# Patient Record
Sex: Male | Born: 1975 | Race: White | Hispanic: No | Marital: Single | Smoking: Current every day smoker
Health system: Southern US, Community
[De-identification: ages and names within clinical notes are randomized; demographics above are authoritative.]

## PROBLEM LIST (undated history)

## (undated) ENCOUNTER — Emergency Department (HOSPITAL_COMMUNITY): Payer: Self-pay

## (undated) DIAGNOSIS — F1410 Cocaine abuse, uncomplicated: Secondary | ICD-10-CM

## (undated) DIAGNOSIS — W3400XA Accidental discharge from unspecified firearms or gun, initial encounter: Secondary | ICD-10-CM

## (undated) DIAGNOSIS — E785 Hyperlipidemia, unspecified: Secondary | ICD-10-CM

## (undated) DIAGNOSIS — I2510 Atherosclerotic heart disease of native coronary artery without angina pectoris: Secondary | ICD-10-CM

## (undated) DIAGNOSIS — Z720 Tobacco use: Secondary | ICD-10-CM

## (undated) DIAGNOSIS — I509 Heart failure, unspecified: Secondary | ICD-10-CM

## (undated) DIAGNOSIS — R569 Unspecified convulsions: Secondary | ICD-10-CM

## (undated) DIAGNOSIS — F1920 Other psychoactive substance dependence, uncomplicated: Secondary | ICD-10-CM

## (undated) HISTORY — PX: CARDIAC CATHETERIZATION: SHX172

## (undated) HISTORY — PX: ABDOMINAL SURGERY: SHX537

## (undated) HISTORY — DX: Unspecified convulsions: R56.9

## (undated) HISTORY — DX: Heart failure, unspecified: I50.9

---

## 2000-09-01 ENCOUNTER — Emergency Department (HOSPITAL_COMMUNITY): Admission: EM | Admit: 2000-09-01 | Discharge: 2000-09-01 | Payer: Self-pay | Admitting: Emergency Medicine

## 2004-08-08 ENCOUNTER — Emergency Department: Payer: Self-pay | Admitting: Emergency Medicine

## 2004-12-01 ENCOUNTER — Emergency Department: Payer: Self-pay | Admitting: Emergency Medicine

## 2013-02-26 DIAGNOSIS — W3400XA Accidental discharge from unspecified firearms or gun, initial encounter: Secondary | ICD-10-CM

## 2013-02-26 DIAGNOSIS — Y249XXA Unspecified firearm discharge, undetermined intent, initial encounter: Secondary | ICD-10-CM

## 2013-02-26 HISTORY — DX: Unspecified firearm discharge, undetermined intent, initial encounter: Y24.9XXA

## 2013-02-26 HISTORY — DX: Accidental discharge from unspecified firearms or gun, initial encounter: W34.00XA

## 2013-05-01 ENCOUNTER — Emergency Department: Payer: Self-pay | Admitting: Emergency Medicine

## 2013-05-16 ENCOUNTER — Emergency Department: Payer: Self-pay | Admitting: Emergency Medicine

## 2013-06-09 ENCOUNTER — Emergency Department: Payer: Self-pay | Admitting: Emergency Medicine

## 2013-07-02 ENCOUNTER — Emergency Department: Payer: Self-pay | Admitting: Emergency Medicine

## 2014-01-01 ENCOUNTER — Inpatient Hospital Stay (HOSPITAL_COMMUNITY)
Admission: EM | Admit: 2014-01-01 | Discharge: 2014-01-03 | DRG: 247 | Disposition: A | Discharge status: Home / Self Care | Payer: Self-pay | Source: Other Acute Inpatient Hospital | Attending: Cardiovascular Disease | Admitting: Cardiovascular Disease

## 2014-01-01 ENCOUNTER — Emergency Department: Payer: Self-pay | Admitting: Emergency Medicine

## 2014-01-01 ENCOUNTER — Encounter (HOSPITAL_COMMUNITY): Payer: Self-pay | Admitting: Cardiovascular Disease

## 2014-01-01 ENCOUNTER — Encounter (HOSPITAL_COMMUNITY)
Admission: EM | Disposition: A | Discharge status: Home / Self Care | Payer: Self-pay | Attending: Cardiovascular Disease

## 2014-01-01 ENCOUNTER — Inpatient Hospital Stay (HOSPITAL_COMMUNITY): Payer: Self-pay

## 2014-01-01 DIAGNOSIS — I2510 Atherosclerotic heart disease of native coronary artery without angina pectoris: Secondary | ICD-10-CM | POA: Diagnosis present

## 2014-01-01 DIAGNOSIS — I2111 ST elevation (STEMI) myocardial infarction involving right coronary artery: Secondary | ICD-10-CM

## 2014-01-01 DIAGNOSIS — Z720 Tobacco use: Secondary | ICD-10-CM | POA: Diagnosis present

## 2014-01-01 DIAGNOSIS — I219 Acute myocardial infarction, unspecified: Secondary | ICD-10-CM

## 2014-01-01 DIAGNOSIS — F1410 Cocaine abuse, uncomplicated: Secondary | ICD-10-CM | POA: Diagnosis present

## 2014-01-01 DIAGNOSIS — Z23 Encounter for immunization: Secondary | ICD-10-CM

## 2014-01-01 DIAGNOSIS — F17210 Nicotine dependence, cigarettes, uncomplicated: Secondary | ICD-10-CM | POA: Diagnosis present

## 2014-01-01 DIAGNOSIS — Z87828 Personal history of other (healed) physical injury and trauma: Secondary | ICD-10-CM

## 2014-01-01 DIAGNOSIS — F1290 Cannabis use, unspecified, uncomplicated: Secondary | ICD-10-CM | POA: Diagnosis present

## 2014-01-01 DIAGNOSIS — W3400XA Accidental discharge from unspecified firearms or gun, initial encounter: Secondary | ICD-10-CM

## 2014-01-01 DIAGNOSIS — F1920 Other psychoactive substance dependence, uncomplicated: Secondary | ICD-10-CM | POA: Diagnosis present

## 2014-01-01 DIAGNOSIS — E785 Hyperlipidemia, unspecified: Secondary | ICD-10-CM | POA: Diagnosis present

## 2014-01-01 DIAGNOSIS — I2119 ST elevation (STEMI) myocardial infarction involving other coronary artery of inferior wall: Principal | ICD-10-CM | POA: Diagnosis present

## 2014-01-01 HISTORY — DX: Cocaine abuse, uncomplicated: F14.10

## 2014-01-01 HISTORY — DX: Hyperlipidemia, unspecified: E78.5

## 2014-01-01 HISTORY — DX: Other psychoactive substance dependence, uncomplicated: F19.20

## 2014-01-01 HISTORY — DX: Accidental discharge from unspecified firearms or gun, initial encounter: W34.00XA

## 2014-01-01 HISTORY — DX: Atherosclerotic heart disease of native coronary artery without angina pectoris: I25.10

## 2014-01-01 HISTORY — DX: Tobacco use: Z72.0

## 2014-01-01 HISTORY — PX: LEFT HEART CATHETERIZATION WITH CORONARY ANGIOGRAM: SHX5451

## 2014-01-01 LAB — CK-MB: CK-MB: 11.2 ng/mL — ABNORMAL HIGH (ref 0.5–3.6)

## 2014-01-01 LAB — CBC
HCT: 49 % (ref 40.0–52.0)
HGB: 16 g/dL (ref 13.0–18.0)
MCH: 30 pg (ref 26.0–34.0)
MCHC: 32.6 g/dL (ref 32.0–36.0)
MCV: 92 fL (ref 80–100)
Platelet: 347 10*3/uL (ref 150–440)
RBC: 5.33 10*6/uL (ref 4.40–5.90)
RDW: 13.2 % (ref 11.5–14.5)
WBC: 12.2 10*3/uL — ABNORMAL HIGH (ref 3.8–10.6)

## 2014-01-01 LAB — PRO B NATRIURETIC PEPTIDE: PRO B NATRI PEPTIDE: 301.3 pg/mL — AB (ref 0–125)

## 2014-01-01 LAB — CK TOTAL AND CKMB (NOT AT ARMC)
CK, MB: 17.9 ng/mL (ref 0.3–4.0)
Relative Index: 7 — ABNORMAL HIGH (ref 0.0–2.5)
Total CK: 254 U/L — ABNORMAL HIGH (ref 7–232)

## 2014-01-01 LAB — TROPONIN I
TROPONIN I: 4.05 ng/mL — AB (ref <0.30)
TROPONIN-I: 1.9 ng/mL — AB
Troponin I: 3.25 ng/mL (ref <0.30)

## 2014-01-01 LAB — COMPREHENSIVE METABOLIC PANEL
ALK PHOS: 107 U/L (ref 39–117)
ALT: 22 U/L (ref 0–53)
AST: 31 U/L (ref 0–37)
Albumin: 3.7 g/dL (ref 3.5–5.2)
Anion gap: 11 (ref 5–15)
BUN: 8 mg/dL (ref 6–23)
CO2: 25 mEq/L (ref 19–32)
Calcium: 8.9 mg/dL (ref 8.4–10.5)
Chloride: 103 mEq/L (ref 96–112)
Creatinine, Ser: 0.75 mg/dL (ref 0.50–1.35)
GFR calc Af Amer: >90 mL/min (ref >90)
Glucose, Bld: 115 mg/dL — ABNORMAL HIGH (ref 70–99)
POTASSIUM: 3.6 meq/L — AB (ref 3.7–5.3)
Sodium: 139 mEq/L (ref 137–147)
Total Bilirubin: 1.1 mg/dL (ref 0.3–1.2)
Total Protein: 7.3 g/dL (ref 6.0–8.3)

## 2014-01-01 LAB — BASIC METABOLIC PANEL
Anion Gap: 7 (ref 7–16)
BUN: 10 mg/dL (ref 7–18)
CHLORIDE: 103 mmol/L (ref 98–107)
Calcium, Total: 8.8 mg/dL (ref 8.5–10.1)
Co2: 28 mmol/L (ref 21–32)
Creatinine: 1.01 mg/dL (ref 0.60–1.30)
EGFR (Non-African Amer.): >60
Glucose: 91 mg/dL (ref 65–99)
Osmolality: 274 (ref 275–301)
POTASSIUM: 4 mmol/L (ref 3.5–5.1)
Sodium: 138 mmol/L (ref 136–145)

## 2014-01-01 LAB — HEMOGLOBIN A1C
Hgb A1c MFr Bld: 5.5 % (ref <5.7)
Mean Plasma Glucose: 111 mg/dL (ref <117)

## 2014-01-01 LAB — MRSA PCR SCREENING: MRSA BY PCR: NEGATIVE

## 2014-01-01 LAB — TSH: TSH: 2.89 u[IU]/mL (ref 0.350–4.500)

## 2014-01-01 SURGERY — LEFT HEART CATHETERIZATION WITH CORONARY ANGIOGRAM
Anesthesia: LOCAL

## 2014-01-01 MED ORDER — LIDOCAINE HCL (PF) 1 % IJ SOLN
INTRAMUSCULAR | Status: AC
  Filled 2014-01-01: qty 30

## 2014-01-01 MED ORDER — BIVALIRUDIN 250 MG IV SOLR
INTRAVENOUS | Status: AC
  Filled 2014-01-01: qty 250

## 2014-01-01 MED ORDER — ASPIRIN 81 MG PO CHEW
81.0000 mg | CHEWABLE_TABLET | Freq: Every day | ORAL | Status: DC
  Administered 2014-01-02 – 2014-01-03 (×2): 81 mg via ORAL
  Filled 2014-01-01 (×2): qty 1

## 2014-01-01 MED ORDER — ONDANSETRON HCL 4 MG/2ML IJ SOLN: 4.0000 mg | Freq: Four times a day (QID) | INTRAMUSCULAR | Status: DC | PRN

## 2014-01-01 MED ORDER — NITROGLYCERIN 0.4 MG SL SUBL: 0.4000 mg | SUBLINGUAL_TABLET | SUBLINGUAL | Status: DC | PRN

## 2014-01-01 MED ORDER — SODIUM CHLORIDE 0.9 % IV SOLN
INTRAVENOUS | Status: AC
  Administered 2014-01-01: 15:00:00 via INTRAVENOUS

## 2014-01-01 MED ORDER — OXYCODONE-ACETAMINOPHEN 5-325 MG PO TABS
1.0000 | ORAL_TABLET | ORAL | Status: DC | PRN
  Administered 2014-01-01: 2 via ORAL
  Filled 2014-01-01: qty 2

## 2014-01-01 MED ORDER — SODIUM CHLORIDE 0.9 % IV SOLN
0.2500 mg/kg/h | INTRAVENOUS | Status: DC
  Filled 2014-01-01: qty 250

## 2014-01-01 MED ORDER — ACETAMINOPHEN 325 MG PO TABS: 650.0000 mg | ORAL_TABLET | ORAL | Status: DC | PRN

## 2014-01-01 MED ORDER — MORPHINE SULFATE 4 MG/ML IJ SOLN
4.0000 mg | INTRAMUSCULAR | Status: DC | PRN
  Administered 2014-01-01: 4 mg via INTRAVENOUS
  Filled 2014-01-01: qty 1

## 2014-01-01 MED ORDER — INFLUENZA VAC SPLIT QUAD 0.5 ML IM SUSY
0.5000 mL | PREFILLED_SYRINGE | INTRAMUSCULAR | Status: AC
  Administered 2014-01-02: 0.5 mL via INTRAMUSCULAR
  Filled 2014-01-01: qty 0.5

## 2014-01-01 MED ORDER — MIDAZOLAM HCL 2 MG/2ML IJ SOLN
INTRAMUSCULAR | Status: AC
  Filled 2014-01-01: qty 2

## 2014-01-01 MED ORDER — FENTANYL CITRATE 0.05 MG/ML IJ SOLN
INTRAMUSCULAR | Status: AC
  Filled 2014-01-01: qty 2

## 2014-01-01 MED ORDER — ATORVASTATIN CALCIUM 80 MG PO TABS
80.0000 mg | ORAL_TABLET | Freq: Every day | ORAL | Status: DC
  Administered 2014-01-01 – 2014-01-02 (×2): 80 mg via ORAL
  Filled 2014-01-01 (×3): qty 1

## 2014-01-01 MED ORDER — HEPARIN (PORCINE) IN NACL 2-0.9 UNIT/ML-% IJ SOLN
INTRAMUSCULAR | Status: AC
  Filled 2014-01-01: qty 1500

## 2014-01-01 MED ORDER — PNEUMOCOCCAL VAC POLYVALENT 25 MCG/0.5ML IJ INJ
0.5000 mL | INJECTION | INTRAMUSCULAR | Status: AC
Start: 2014-01-02 — End: 2014-01-02
  Administered 2014-01-02: 0.5 mL via INTRAMUSCULAR
  Filled 2014-01-01: qty 0.5

## 2014-01-01 MED ORDER — POTASSIUM CHLORIDE CRYS ER 20 MEQ PO TBCR
40.0000 meq | EXTENDED_RELEASE_TABLET | Freq: Once | ORAL | Status: AC
  Administered 2014-01-01: 40 meq via ORAL
  Filled 2014-01-01: qty 2

## 2014-01-01 MED ORDER — CETYLPYRIDINIUM CHLORIDE 0.05 % MT LIQD
7.0000 mL | Freq: Two times a day (BID) | OROMUCOSAL | Status: DC
  Administered 2014-01-02 (×2): 7 mL via OROMUCOSAL

## 2014-01-01 MED ORDER — VERAPAMIL HCL 2.5 MG/ML IV SOLN
INTRAVENOUS | Status: AC
  Filled 2014-01-01: qty 2

## 2014-01-01 MED ORDER — NITROGLYCERIN 1 MG/10 ML FOR IR/CATH LAB
INTRA_ARTERIAL | Status: AC
  Filled 2014-01-01: qty 10

## 2014-01-01 MED ORDER — TICAGRELOR 90 MG PO TABS
90.0000 mg | ORAL_TABLET | Freq: Two times a day (BID) | ORAL | Status: DC
  Administered 2014-01-01 – 2014-01-03 (×4): 90 mg via ORAL
  Filled 2014-01-01 (×5): qty 1

## 2014-01-01 NOTE — Progress Notes (Signed)
Chaplain checked in with ED to see if family present. Chaplain made her presence known and asked ED registration to page her if/when family arrived. Page chaplain as needed.   01/01/14 1200  Clinical Encounter Type  Visited With Health care provider  Visit Type Code  Referral From Nurse  Charmian Muff, Chaplain 01/01/2014 3:27 PM

## 2014-01-01 NOTE — Progress Notes (Signed)
CRITICAL VALUE ALERT  Critical value received:  Troponin 3.25, CKMB 17.9, Potassium 3.6  Date of notification:  01/01/2014  Time of notification:  1730  Critical value read back:Yes.    Nurse who received alert:  Dayna Barker  MD notified (1st page):  Dr. Tresa Endo  Time of first page:  1735  MD notified (2nd page):  Time of second page:  Responding MD:  Dr. Tresa Endo  Time MD responded:  1737, orders received

## 2014-01-01 NOTE — Progress Notes (Signed)
EKG CRITICAL VALUE     12 lead EKG performed.  Critical value noted.Dayna Barker, RN notified.   Apollonia Amini L, CCT 01/01/2014 2:30 PM

## 2014-01-01 NOTE — CV Procedure (Signed)
Cardiac Catheterization Procedure Note  Name: Sallie Maker MRN: [id number] DOB: Aug 29, 1975  Procedure: Left Heart Cath, Selective Coronary Angiography, LV angiography, PTCA and stenting of the mid-RCA  Indication: Inferior STEMI  Procedural Details:  The right wrist was prepped, draped, and anesthetized with 1% lidocaine. Using the modified Seldinger technique, a 5/6 French Slender sheath was introduced into the right radial artery. 3 mg of verapamil was administered through the sheath, weight-based unfractionated heparin was administered intravenously. Standard Judkins catheters were used for selective coronary angiography and left ventriculography. Ventriculography was done after PCI. Catheter exchanges were performed over an exchange length guidewire.  PROCEDURAL FINDINGS Hemodynamics: AO 111/72 LV 113/9   Coronary angiography: Coronary dominance: right  Left mainstem: Patent with mild diffuse irregularity but no obstruction.  Left anterior descending (LAD): Diffuse atherosclerosis noted with heavy irregularity throughout the prox and mid-LAD and first diagonal. The mid-LAD has 60% stenosis  Left circumflex (LCx): Widely patent vessel supplies a single OM. No obstruction.  Right coronary artery (RCA): Large dominant vessel. There is thrombus in the ostium of the acute marginal branch. There is 80% ostial stenosis with thrombus associated. The mid-RCA in the area of the acute marginal has diffuse 40% irregularity. The PDA and PLA branches are patent.   Left ventriculography: Low normal LV function with mild global LV hypokinesis. LVEF is 45-50%. No MR visualized.  PCI Note:  Following the diagnostic procedure, the decision was made to proceed with PCI of the RCA/acute marginal bifurcation. This is a complex area as it involves the bifurcation and the acute marginal appears to supply a significant amount of myocardium. Wires were advanced into each vessel without difficulty.  Weight-based bivalirudin was given for anticoagulation. Once a therapeutic ACT was achieved, a 6 [place] JR4 guide catheter was inserted.   The lesion was predilated with a 2.5 mm balloon.  There was significant (>50% residual stenosis) stenosis so a 3.0 balloon was used to redilate the vessel. The lesion was then stented with a 3.0 x 12 mm Promus DES. A 3.5 mm balloon was inflated simultaneously in the mid RCA at the time of stent deployment. Despite stent position in the origin of the acute marginal, the stent appeared to migrate proximally after deployment and was located in the mid-RCA. I decided to perform IVUS to clarify the anatomy. The proximal stent was well-deployed but no apposed. The distal stent was underdeployed. The entire stent was post-dilated with a 3.5 mm [place] balloon to high pressure and the proximal stent was dilated with a 4.0 mm [place] balloon to 16 atm.  Following PCI, there was 30% residual stenosis in the acute marginal and no residual stenosis in the mid-RCA and TIMI-3 flow in both vessels. Final angiography confirmed an excellent result. The patient tolerated the procedure well. There were no immediate procedural complications. A TR band was used for radial hemostasis. The patient was transferred to the post catheterization recovery area for further monitoring.  PCI Data: Lesion 1 Vessel - acute marginal/Segment - ostium Percent Stenosis (pre)  80 TIMI-flow 3 Stent none Percent Stenosis (post) 30 TIMI-flow (post) 3  Lesion 2 Vessel - RCA/Segment - mid Percent Stenosis (pre)  50 (thrombus) TIMI-flow 3 Stent 3.0x12 mm Promus (post-dil with 4.0 [place] balloon to 16 atm) Percent Stenosis (post) 0 TIMI-flow (post) 3  Estimated Blood Loss: minimal  Final Conclusions:   1. Thrombotic stenosis of the RCA/acute marginal treated with PCI as detailed above (single DES and bifurcation angioplasty) 2. Moderate LAD stenosis  3. Patent LCx 4. Low-normal LV function   Recommendations:    ASA/brilinta x 12 months if feasible. May need to change to plavix after 30 days. Post-MI med Rx. No beta-blocker (recent cocaine use).  Sherren Mocha MD, [facility] 01/01/2014, 1:40 PM
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
## 2014-01-02 DIAGNOSIS — I2111 ST elevation (STEMI) myocardial infarction involving right coronary artery: Secondary | ICD-10-CM

## 2014-01-02 LAB — CBC
HCT: 41.8 % (ref 39.0–52.0)
Hemoglobin: 14.1 g/dL (ref 13.0–17.0)
MCH: 30.7 pg (ref 26.0–34.0)
MCHC: 33.7 g/dL (ref 30.0–36.0)
MCV: 90.9 fL (ref 78.0–100.0)
Platelets: 259 10*3/uL (ref 150–400)
RBC: 4.6 MIL/uL (ref 4.22–5.81)
RDW: 13 % (ref 11.5–15.5)
WBC: 7.2 10*3/uL (ref 4.0–10.5)

## 2014-01-02 LAB — LIPID PANEL
Cholesterol: 169 mg/dL (ref 0–200)
HDL: 56 mg/dL (ref >39)
LDL CALC: 91 mg/dL (ref 0–99)
TRIGLYCERIDES: 108 mg/dL (ref <150)
Total CHOL/HDL Ratio: 3 RATIO
VLDL: 22 mg/dL (ref 0–40)

## 2014-01-02 LAB — BASIC METABOLIC PANEL
Anion gap: 12 (ref 5–15)
BUN: 9 mg/dL (ref 6–23)
CO2: 21 mEq/L (ref 19–32)
Calcium: 8.6 mg/dL (ref 8.4–10.5)
Chloride: 104 mEq/L (ref 96–112)
Creatinine, Ser: 0.83 mg/dL (ref 0.50–1.35)
GFR calc Af Amer: >90 mL/min (ref >90)
GFR calc non Af Amer: >90 mL/min (ref >90)
GLUCOSE: 107 mg/dL — AB (ref 70–99)
POTASSIUM: 4.4 meq/L (ref 3.7–5.3)
Sodium: 137 mEq/L (ref 137–147)

## 2014-01-02 LAB — TROPONIN I: Troponin I: 2.1 ng/mL (ref <0.30)

## 2014-01-02 NOTE — Plan of Care (Signed)
Problem: Phase II Progression Outcomes Goal: Hemodynamically stable Outcome: Completed/Met Date Met:  01/02/14 Goal: Vascular site scale level 0 - I Vascular Site Scale Level 0: No bruising/bleeding/hematoma Level I (Mild): Bruising/Ecchymosis, minimal bleeding/ooozing, palpable hematoma < 3 cm Level II (Moderate): Bleeding not affecting hemodynamic parameters, pseudoaneurysm, palpable hematoma > 3 cm Level III (Severe) Bleeding which affects hemodynamic parameters or retroperitoneal hemorrhage  Outcome: Completed/Met Date Met:  01/02/14 Goal: Wean IV nitroglycerin to PO or topical Outcome: Completed/Met Date Met:  01/02/14 Goal: Tolerating diet Outcome: Completed/Met Date Met:  01/02/14

## 2014-01-02 NOTE — Progress Notes (Signed)
Radial TR band removed by Devonne Doughty, RN. No complications, site is level 0. Transparent dressing placed.

## 2014-01-02 NOTE — Plan of Care (Signed)
Problem: Phase I Progression Outcomes Goal: Anginal pain relieved Outcome: Completed/Met Date Met:  01/02/14     

## 2014-01-02 NOTE — Plan of Care (Signed)
Problem: Phase II Progression Outcomes Goal: Anginal pain absent Outcome: Completed/Met Date Met:  01/02/14 Goal: Discharge plan established Outcome: Completed/Met Date Met:  01/02/14

## 2014-01-02 NOTE — Progress Notes (Signed)
Patient Name: Peter Ruiz Date of Encounter: 01/02/2014     Active Problems:   Acute transmural inferior wall MI    SUBJECTIVE  The patient feels well this morning.  No chest pain or shortness of breath.  Vital signs are stable.  He is on dual antiplatelet therapy and high-dose statin.  No beta blocker because of cocaine use.  CURRENT MEDS . antiseptic oral rinse  7 mL Mouth Rinse BID  . aspirin  81 mg Oral Daily  . atorvastatin  80 mg Oral q1800  . Influenza vac split quadrivalent PF  0.5 mL Intramuscular Tomorrow-1000  . pneumococcal 23 valent vaccine  0.5 mL Intramuscular Tomorrow-1000  . ticagrelor  90 mg Oral BID    OBJECTIVE  Filed Vitals:   01/02/14 0500 01/02/14 0600 01/02/14 0700 01/02/14 0800  BP: 99/59 99/69 107/63   Pulse: 53 57 57   Temp:    98 F (36.7 C)  TempSrc:    Oral  Resp: 8 13 17    Height:      Weight:      SpO2: 99% 100% 100%     Intake/Output Summary (Last 24 hours) at 01/02/14 0916 Last data filed at 01/02/14 0800  Gross per 24 hour  Intake 1446.67 ml  Output   1950 ml  Net -503.33 ml   Filed Weights   01/01/14 1100 01/01/14 1410 01/02/14 0300  Weight: 189 lb 9.5 oz (86 kg) 185 lb 10 oz (84.2 kg) 193 lb 2 oz (87.6 kg)    PHYSICAL EXAM  General: Pleasant, NAD. Neuro: Alert and oriented X 3. Moves all extremities spontaneously. Psych: Normal affect. HEENT:  Normal  Neck: Supple without bruits or JVD. Lungs:  Resp regular and unlabored, CTA. Heart: RRR no s3, s4, or murmurs. Abdomen: Soft, non-tender, non-distended, BS + x 4.  Extremities: No clubbing, cyanosis or edema. DP/PT/Radials 2+ and equal bilaterally.  Accessory Clinical Findings  CBC  Recent Labs  01/02/14 0215  WBC 7.2  HGB 14.1  HCT 41.8  MCV 90.9  PLT 259   Basic Metabolic Panel  Recent Labs  01/01/14 1600 01/02/14 0215  NA 139 137  K 3.6* 4.4  CL 103 104  CO2 25 21  GLUCOSE 115* 107*  BUN 8 9  CREATININE 0.75 0.83  CALCIUM 8.9 8.6    Liver Function Tests  Recent Labs  01/01/14 1600  AST 31  ALT 22  ALKPHOS 107  BILITOT 1.1  PROT 7.3  ALBUMIN 3.7   No results for input(s): LIPASE, AMYLASE in the last 72 hours. Cardiac Enzymes  Recent Labs  01/01/14 1600 01/01/14 1948 01/02/14 0215  CKTOTAL 254*  --   --   CKMB 17.9*  --   --   TROPONINI 3.25* 4.05* 2.10*   BNP Invalid input(s): POCBNP D-Dimer No results for input(s): DDIMER in the last 72 hours. Hemoglobin A1C  Recent Labs  01/01/14 1600  HGBA1C 5.5   Fasting Lipid Panel  Recent Labs  01/02/14 0215  CHOL 169  HDL 56  LDLCALC 91  TRIG 108  CHOLHDL 3.0   Thyroid Function Tests  Recent Labs  01/01/14 1600  TSH 2.890    TELE  Normal sinus rhythm  ECG  Normal sinus rhythm with evolving inferior wall MI  Radiology/Studies  Portable Chest X-ray 1 View  01/01/2014   CLINICAL DATA:  Acute myocardial infarct.  Coronary artery disease.  EXAM: PORTABLE CHEST - 1 VIEW  COMPARISON:  None.  FINDINGS: The heart  size and mediastinal contours are within normal limits. Both lungs are clear. No evidence pleural effusion. Several old left posterior rib fracture deformities are noted.  IMPRESSION: No active disease.   Electronically Signed   By: Myles Rosenthal M.D.   On: 01/01/2014 14:46    ASSESSMENT AND PLAN  1. Thrombotic stenosis of the RCA/acute marginal treated with PCI as detailed above (single DES and bifurcation angioplasty) 2. Moderate LAD stenosis 3. Patent LCx 4. Low-normal LV function   Plan: Transfer to telemetry today.  Cardiac rehabilitation.  Ambulate.  Likely discharge Monday. Signed, Cassell Clement MD

## 2014-01-03 ENCOUNTER — Encounter (HOSPITAL_COMMUNITY): Payer: Self-pay | Admitting: Physician Assistant

## 2014-01-03 DIAGNOSIS — I2510 Atherosclerotic heart disease of native coronary artery without angina pectoris: Secondary | ICD-10-CM | POA: Diagnosis present

## 2014-01-03 DIAGNOSIS — W3400XA Accidental discharge from unspecified firearms or gun, initial encounter: Secondary | ICD-10-CM

## 2014-01-03 DIAGNOSIS — E785 Hyperlipidemia, unspecified: Secondary | ICD-10-CM

## 2014-01-03 DIAGNOSIS — Z720 Tobacco use: Secondary | ICD-10-CM | POA: Diagnosis present

## 2014-01-03 DIAGNOSIS — F1920 Other psychoactive substance dependence, uncomplicated: Secondary | ICD-10-CM | POA: Diagnosis present

## 2014-01-03 DIAGNOSIS — I2119 ST elevation (STEMI) myocardial infarction involving other coronary artery of inferior wall: Principal | ICD-10-CM

## 2014-01-03 DIAGNOSIS — F1410 Cocaine abuse, uncomplicated: Secondary | ICD-10-CM | POA: Diagnosis present

## 2014-01-03 LAB — POCT ACTIVATED CLOTTING TIME: Activated Clotting Time: 461 seconds

## 2014-01-03 MED ORDER — TICAGRELOR 90 MG PO TABS: 90.0000 mg | ORAL_TABLET | Freq: Two times a day (BID) | ORAL | Status: DC

## 2014-01-03 MED ORDER — NICOTINE 14 MG/24HR TD PT24: 14.0000 mg | MEDICATED_PATCH | Freq: Every day | TRANSDERMAL | Status: DC

## 2014-01-03 MED ORDER — ASPIRIN 81 MG PO CHEW: 81.0000 mg | CHEWABLE_TABLET | Freq: Every day | ORAL | Status: AC

## 2014-01-03 MED ORDER — NITROGLYCERIN 0.4 MG SL SUBL: 0.4000 mg | SUBLINGUAL_TABLET | SUBLINGUAL | Status: AC | PRN

## 2014-01-03 MED ORDER — ATORVASTATIN CALCIUM 80 MG PO TABS: 80.0000 mg | ORAL_TABLET | Freq: Every day | ORAL | Status: DC

## 2014-01-03 MED ORDER — NICOTINE 14 MG/24HR TD PT24
14.0000 mg | MEDICATED_PATCH | Freq: Every day | TRANSDERMAL | Status: DC
  Filled 2014-01-03: qty 1

## 2014-01-03 MED FILL — Sodium Chloride IV Soln 0.9%: INTRAVENOUS | Qty: 50 | Status: AC
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
## 2014-01-03 NOTE — Progress Notes (Signed)
Patient Name: Anar Escobedo Date of Encounter: 01/03/2014     Active Problems:   Acute transmural inferior wall MI   Acute MI    SUBJECTIVE  The patient feels well this morning.  No events overnight. Troponin is coming down.  CURRENT MEDS . antiseptic oral rinse  7 mL Mouth Rinse BID  . aspirin  81 mg Oral Daily  . atorvastatin  80 mg Oral q1800  . ticagrelor  90 mg Oral BID    OBJECTIVE  Filed Vitals:   01/02/14 1049 01/02/14 1411 01/02/14 2106 01/03/14 0450  BP: 100/76 141/76 122/76 106/63  Pulse:  59 65 85  Temp: 98 F (36.7 C) 98 F (36.7 C) 98.6 F (37 C) 98.4 F (36.9 C)  TempSrc: Oral Oral Oral Oral  Resp: 18 19 18 18   Height:      Weight:    188 lb (85.276 kg)  SpO2: 100% 100% 97% 98%    Intake/Output Summary (Last 24 hours) at 01/03/14 0754 Last data filed at 01/02/14 2000  Gross per 24 hour  Intake    600 ml  Output    775 ml  Net   -175 ml   Filed Weights   01/01/14 1410 01/02/14 0300 01/03/14 0450  Weight: 185 lb 10 oz (84.2 kg) 193 lb 2 oz (87.6 kg) 188 lb (85.276 kg)    PHYSICAL EXAM  General: Pleasant, NAD. Neuro: Alert and oriented X 3. Moves all extremities spontaneously. Psych: Normal affect. HEENT:  Normal  Neck: Supple without bruits or JVD. Lungs:  Resp regular and unlabored, CTA. Heart: RRR no s3, s4, or murmurs. Abdomen: Soft, non-tender, non-distended, BS + x 4.  Extremities: No clubbing, cyanosis or edema. DP/PT/Radials 2+ and equal bilaterally.  Accessory Clinical Findings  CBC  Recent Labs  01/02/14 0215  WBC 7.2  HGB 14.1  HCT 41.8  MCV 90.9  PLT 259   Basic Metabolic Panel  Recent Labs  01/01/14 1600 01/02/14 0215  NA 139 137  K 3.6* 4.4  CL 103 104  CO2 25 21  GLUCOSE 115* 107*  BUN 8 9  CREATININE 0.75 0.83  CALCIUM 8.9 8.6   Liver Function Tests  Recent Labs  01/01/14 1600  AST 31  ALT 22  ALKPHOS 107  BILITOT 1.1  PROT 7.3  ALBUMIN 3.7   No results for input(s): LIPASE, AMYLASE  in the last 72 hours. Cardiac Enzymes  Recent Labs  01/01/14 1600 01/01/14 1948 01/02/14 0215  CKTOTAL 254*  --   --   CKMB 17.9*  --   --   TROPONINI 3.25* 4.05* 2.10*   BNP Invalid input(s): POCBNP D-Dimer No results for input(s): DDIMER in the last 72 hours. Hemoglobin A1C  Recent Labs  01/01/14 1600  HGBA1C 5.5   Fasting Lipid Panel  Recent Labs  01/02/14 0215  CHOL 169  HDL 56  LDLCALC 91  TRIG 108  CHOLHDL 3.0   Thyroid Function Tests  Recent Labs  01/01/14 1600  TSH 2.890    TELE  Normal sinus rhythm  ECG  Normal sinus rhythm with evolving inferior wall MI  Radiology/Studies  Portable Chest X-ray 1 View  01/01/2014   CLINICAL DATA:  Acute myocardial infarct.  Coronary artery disease.  EXAM: PORTABLE CHEST - 1 VIEW  COMPARISON:  None.  FINDINGS: The heart size and mediastinal contours are within normal limits. Both lungs are clear. No evidence pleural effusion. Several old left posterior rib fracture deformities are noted.  IMPRESSION: No active disease.   Electronically Signed   By: Myles RosenthalJohn  Stahl M.D.   On: 01/01/2014 14:46    ASSESSMENT AND PLAN  1. Thrombotic stenosis of the RCA/acute marginal treated with PCI as detailed above (single DES and bifurcation angioplasty) 2. Moderate LAD stenosis 3. Patent LCx 4. Low-normal LV function   Plan: Ok for discharge today. Will need cardiac rehab post-discharge.   He does not have insurance - will need case management assistance today to be able to get medicines prior to discharge. Follow-up with Dr. Excell Seltzerooper or MLP in 1-2 weeks post-discharge.  Chrystie NoseKenneth C. Hilty, MD, [facility] ZarzamoraFACC Attending Cardiologist [facility]   HILTY,Kenneth C

## 2014-01-03 NOTE — Discharge Instructions (Signed)
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
## 2014-01-09 ENCOUNTER — Inpatient Hospital Stay: Payer: Self-pay | Admitting: Internal Medicine

## 2014-01-09 DIAGNOSIS — I200 Unstable angina: Secondary | ICD-10-CM

## 2014-01-09 LAB — BASIC METABOLIC PANEL
Anion Gap: 8 (ref 7–16)
BUN: 11 mg/dL (ref 7–18)
Calcium, Total: 8.1 mg/dL — ABNORMAL LOW (ref 8.5–10.1)
Chloride: 107 mmol/L (ref 98–107)
Co2: 27 mmol/L (ref 21–32)
Creatinine: 0.99 mg/dL (ref 0.60–1.30)
Glucose: 68 mg/dL (ref 65–99)
Osmolality: 281 (ref 275–301)
POTASSIUM: 3.4 mmol/L — AB (ref 3.5–5.1)
Sodium: 142 mmol/L (ref 136–145)

## 2014-01-09 LAB — CK TOTAL AND CKMB (NOT AT ARMC)
CK, TOTAL: 282 U/L
CK, Total: 310 U/L — ABNORMAL HIGH
CK, Total: 248 U/L
CK-MB: 5.7 ng/mL — ABNORMAL HIGH (ref 0.5–3.6)
CK-MB: 4.9 ng/mL — ABNORMAL HIGH (ref 0.5–3.6)
CK-MB: 3.7 ng/mL — ABNORMAL HIGH (ref 0.5–3.6)

## 2014-01-09 LAB — CBC
HCT: 39.9 % — ABNORMAL LOW (ref 40.0–52.0)
HGB: 13.5 g/dL (ref 13.0–18.0)
MCH: 30.9 pg (ref 26.0–34.0)
MCHC: 33.9 g/dL (ref 32.0–36.0)
MCV: 91 fL (ref 80–100)
PLATELETS: 330 10*3/uL (ref 150–440)
RBC: 4.37 10*6/uL — AB (ref 4.40–5.90)
RDW: 13.3 % (ref 11.5–14.5)
WBC: 9.9 10*3/uL (ref 3.8–10.6)

## 2014-01-09 LAB — TROPONIN I
Troponin-I: 0.1 ng/mL — ABNORMAL HIGH
Troponin-I: 2.1 ng/mL — ABNORMAL HIGH
Troponin-I: 1.7 ng/mL — ABNORMAL HIGH
Troponin-I: 1.2 ng/mL — ABNORMAL HIGH

## 2014-01-09 LAB — CK-MB: CK-MB: 1.4 ng/mL (ref 0.5–3.6)

## 2014-01-09 LAB — MAGNESIUM: Magnesium: 1.6 mg/dL — ABNORMAL LOW

## 2014-01-10 ENCOUNTER — Encounter: Payer: Self-pay | Admitting: Cardiovascular Disease

## 2014-01-10 DIAGNOSIS — I25110 Atherosclerotic heart disease of native coronary artery with unstable angina pectoris: Secondary | ICD-10-CM

## 2014-01-10 LAB — LIPID PANEL
CHOLESTEROL: 134 mg/dL (ref 0–200)
HDL: 53 mg/dL (ref 40–60)
LDL CHOLESTEROL, CALC: 63 mg/dL (ref 0–100)
Triglycerides: 90 mg/dL (ref 0–200)
VLDL Cholesterol, Calc: 18 mg/dL (ref 5–40)

## 2014-01-10 LAB — HEMOGLOBIN A1C: Hemoglobin A1C: 5.2 % (ref 4.2–6.3)

## 2014-01-10 LAB — BASIC METABOLIC PANEL
Anion Gap: 7 (ref 7–16)
BUN: 10 mg/dL (ref 7–18)
CALCIUM: 8.1 mg/dL — AB (ref 8.5–10.1)
Chloride: 108 mmol/L — ABNORMAL HIGH (ref 98–107)
Co2: 29 mmol/L (ref 21–32)
Creatinine: 1.05 mg/dL (ref 0.60–1.30)
EGFR (African American): >60
EGFR (Non-African Amer.): >60
Glucose: 88 mg/dL (ref 65–99)
OSMOLALITY: 285 (ref 275–301)
Potassium: 4 mmol/L (ref 3.5–5.1)
Sodium: 144 mmol/L (ref 136–145)

## 2014-01-10 LAB — MAGNESIUM: Magnesium: 1.9 mg/dL

## 2014-01-18 ENCOUNTER — Encounter: Payer: Self-pay | Admitting: Cardiovascular Disease

## 2014-01-21 ENCOUNTER — Encounter: Payer: Self-pay | Admitting: Cardiovascular Disease

## 2014-02-10 ENCOUNTER — Encounter (HOSPITAL_COMMUNITY): Payer: Self-pay | Admitting: Cardiovascular Disease

## 2014-05-21 ENCOUNTER — Inpatient Hospital Stay: Payer: Self-pay | Admitting: Internal Medicine

## 2014-05-23 ENCOUNTER — Telehealth: Payer: Self-pay

## 2014-05-23 NOTE — Telephone Encounter (Signed)
Spoke with, Gearldine Bienenstock, pt fiance

## 2014-06-25 NOTE — Discharge Summary (Signed)
PATIENT NAME:  Peter Ruiz, Peter Ruiz MR#:  [id number] DATE OF BIRTH:  December 10, 1975  DATE OF ADMISSION:  01/09/2014 DATE OF DISCHARGE:    PRIMARY CARE PHYSICIAN: Nonlocal.   DISCHARGE DIAGNOSES:   1.  Non ST elevation myocardial infarction.  2.  Coronary artery disease.  3.  Tobacco abuse.   CONDITION: Stable.   CODE STATUS: Full code.   HOME MEDICATIONS: Please refer to the medication reconciliation list.   DIET: Low sodium, low fat diet.   ACTIVITY: As tolerated. The patient needs smoking cessation and compliance to medications.   FOLLOW-UP CARE: Follow with PCP, and Iran Ouch, MD within 1 to 2 weeks.   REASON FOR ADMISSION: Chest pain.   HOSPITAL COURSE: The patient is a 39 year old Caucasian male with a history of CAD, MI; 1 week ago presented to the ED with chest pain. The patient denies any other symptoms. He was treated with nitroglycerin and admitted for non STEMI due to the elevated troponin at 0.1. The patient has been treated with aspirin, Plavix, Lovenox 1 mg/kg b.i.d.; in addition patient has been treated with atorvastatin, and nitroglycerin p.r.n. The patient's troponin increased to 2.1, then decreased to 1.2; Dr. Kirke Corin suggested cardiac catheterization which was done just now. According to Dr. Kirke Corin, the patient needs aggressive medical treatment, smoking cessation, and compliance medications. He suggests patient can be discharged today. The patient has no complaints after admission. Vital signs are stable. He is clinically stable. I advised the patient to be compliant with medications and smoking cessation. He is clinically stable and will be discharged to home today. I discussed the patient's discharge plan with the patient, nurse, case manager.   TIME SPENT: About 37 minutes.      ____________________________ Shaune Pollack, MD qc:nt D: 01/10/2014 14:53:47 ET T: 01/10/2014 15:38:32 ET JOB#: [id number]  cc: Shaune Pollack, MD, <Dictator> Shaune Pollack MD ELECTRONICALLY SIGNED  01/10/2014 16:59
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
## 2015-06-14 ENCOUNTER — Encounter: Payer: Self-pay | Admitting: *Deleted

## 2015-06-14 ENCOUNTER — Emergency Department
Admission: EM | Admit: 2015-06-14 | Discharge: 2015-06-14 | Disposition: A | Discharge status: Home / Self Care | Payer: Medicaid Other | Attending: Emergency Medicine | Admitting: Emergency Medicine

## 2015-06-14 ENCOUNTER — Emergency Department: Payer: Medicaid Other

## 2015-06-14 DIAGNOSIS — R200 Anesthesia of skin: Secondary | ICD-10-CM | POA: Insufficient documentation

## 2015-06-14 DIAGNOSIS — R0789 Other chest pain: Secondary | ICD-10-CM | POA: Diagnosis present

## 2015-06-14 DIAGNOSIS — R0602 Shortness of breath: Secondary | ICD-10-CM | POA: Diagnosis not present

## 2015-06-14 DIAGNOSIS — Z5321 Procedure and treatment not carried out due to patient leaving prior to being seen by health care provider: Secondary | ICD-10-CM | POA: Diagnosis not present

## 2015-06-14 DIAGNOSIS — F17200 Nicotine dependence, unspecified, uncomplicated: Secondary | ICD-10-CM | POA: Diagnosis not present

## 2015-06-14 DIAGNOSIS — M25512 Pain in left shoulder: Secondary | ICD-10-CM | POA: Diagnosis not present

## 2015-06-14 LAB — CBC
HCT: 40.9 % (ref 40.0–52.0)
HEMOGLOBIN: 14.2 g/dL (ref 13.0–18.0)
MCH: 30.9 pg (ref 26.0–34.0)
MCHC: 34.7 g/dL (ref 32.0–36.0)
MCV: 88.9 fL (ref 80.0–100.0)
Platelets: 310 10*3/uL (ref 150–440)
RBC: 4.6 MIL/uL (ref 4.40–5.90)
RDW: 12.6 % (ref 11.5–14.5)
WBC: 9.6 10*3/uL (ref 3.8–10.6)

## 2015-06-14 LAB — BASIC METABOLIC PANEL
ANION GAP: 6 (ref 5–15)
BUN: 12 mg/dL (ref 6–20)
CALCIUM: 8.8 mg/dL — AB (ref 8.9–10.3)
CO2: 23 mmol/L (ref 22–32)
Chloride: 107 mmol/L (ref 101–111)
Creatinine, Ser: 0.95 mg/dL (ref 0.61–1.24)
GFR calc Af Amer: >60 mL/min (ref >60)
GLUCOSE: 121 mg/dL — AB (ref 65–99)
Potassium: 3.7 mmol/L (ref 3.5–5.1)
Sodium: 136 mmol/L (ref 135–145)

## 2015-06-14 LAB — TROPONIN I

## 2015-06-14 NOTE — ED Notes (Signed)
Pt arrives with complaints with mid chest pain and left arm numbness for several days, worsening over the past day, states pain is pressure in nature, states left shoulder blade pain and SOB, pt states hx of stents but did not follow up with cardiology or continue to take his medication, pt awake and alert in no acute distress, states hx of cocaine abuse

## 2015-06-15 ENCOUNTER — Telehealth: Payer: Self-pay | Admitting: Emergency Medicine

## 2015-06-15 NOTE — ED Notes (Signed)
Called patient due to lwot to inquire about condition and follow up plans. Left message.   

## 2015-09-22 IMAGING — CR DG CHEST 1V PORT
1 series · 1 of 1 positions shown · non-contrast
Comparison: None.

CLINICAL DATA: Chest pain.

EXAM:
PORTABLE CHEST - 1 VIEW

[ap]
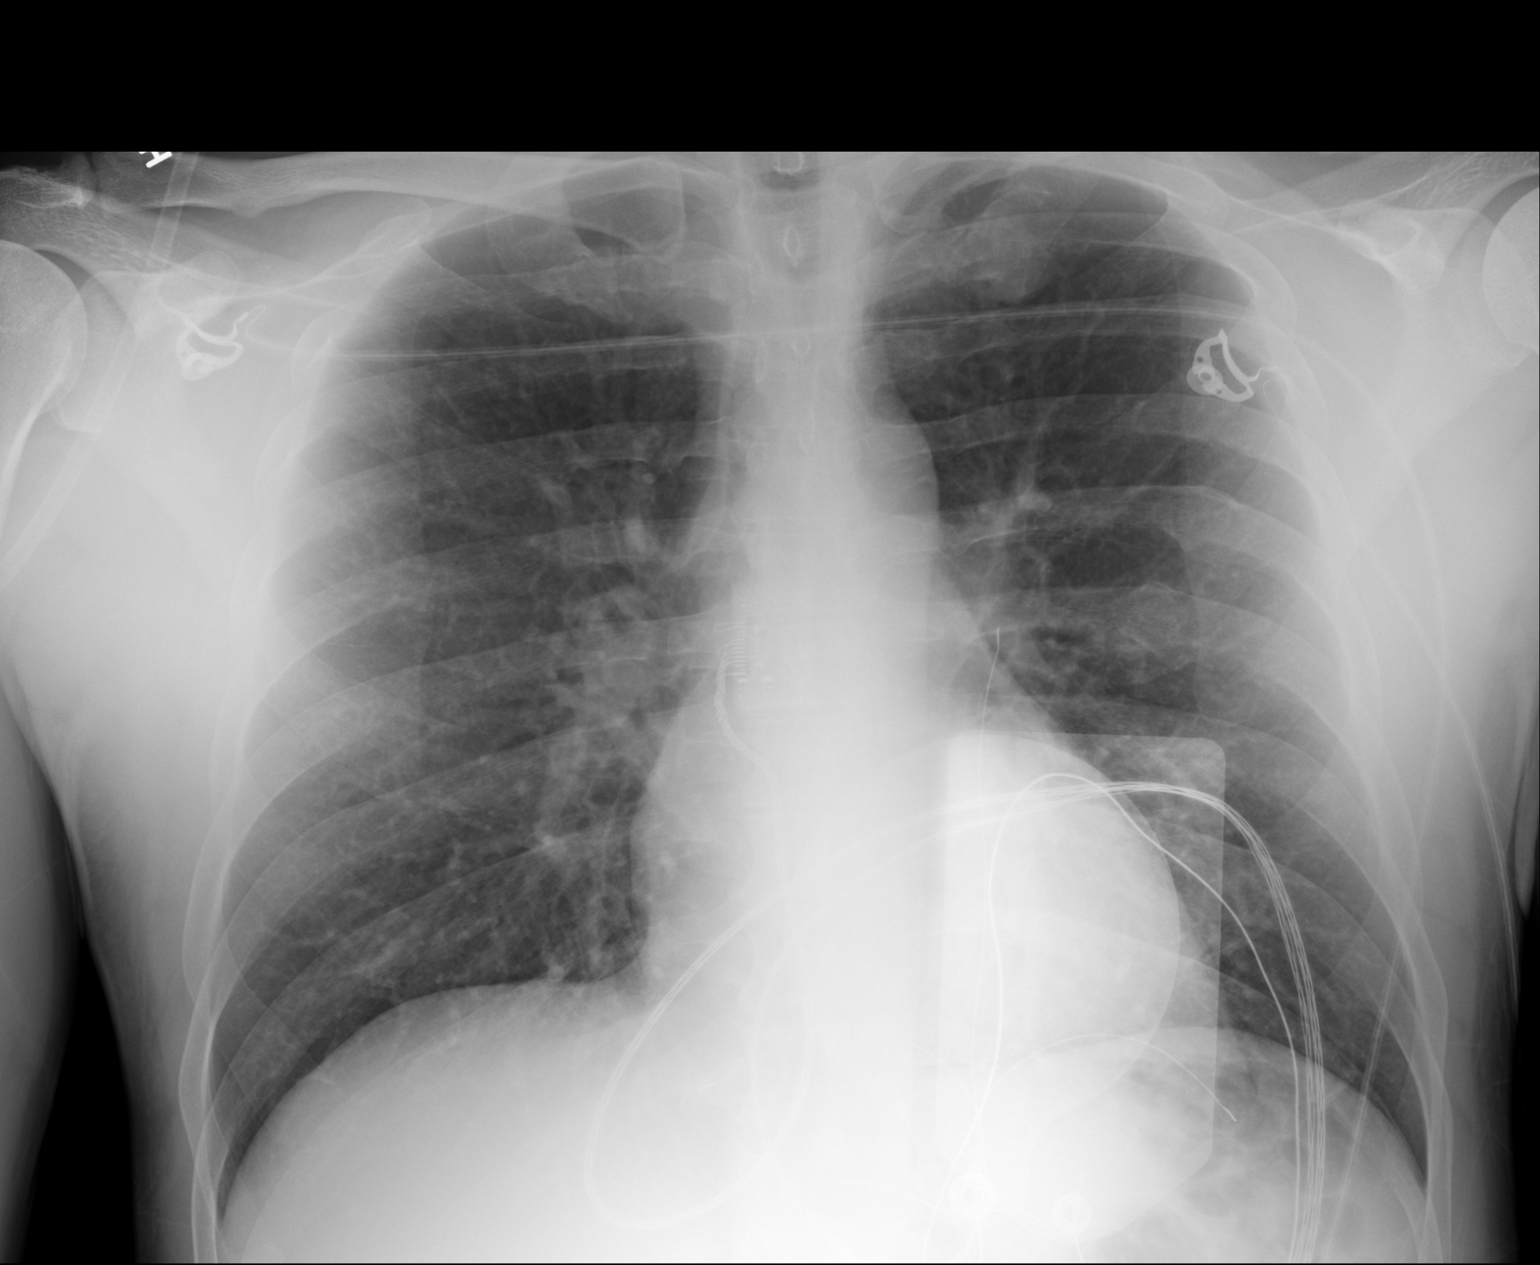

[1 of 1 positions shown; findings below may reference images not displayed]

FINDINGS: Mediastinum hilar structures normal the lungs are clear. Heart size
normal. No pleural effusion or pneumothorax.Left posterior sixth and
seventh rib fractures noted. These may be old.
IMPRESSION: 1. No acute cardiopulmonary disease.
2. Left posterior sixth and seventh rib fractures, these may be old.

## 2015-09-30 IMAGING — CR DG CHEST 1V PORT
1 series · 1 of 1 positions shown · non-contrast
Comparison: None.

CLINICAL DATA: Midsternal chest pain starting at 9899 hr wall
walking down a flight of steps.

EXAM:
PORTABLE CHEST - 1 VIEW

[ap]
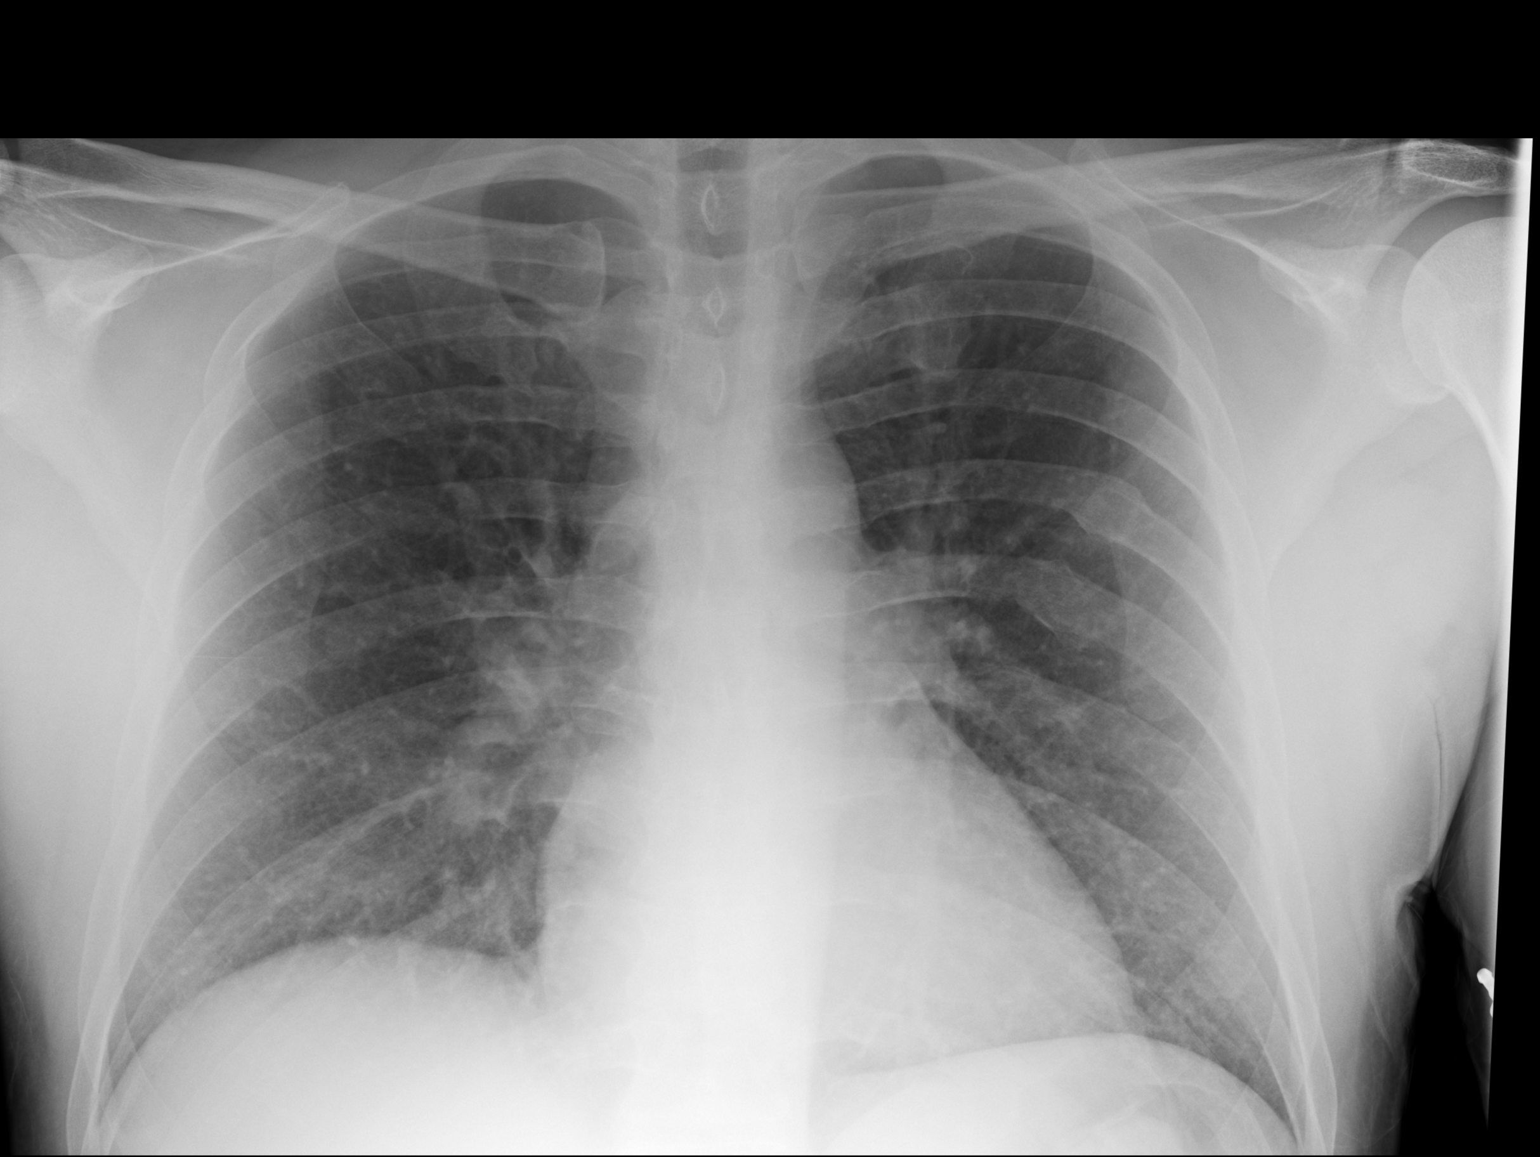

[1 of 1 positions shown; findings below may reference images not displayed]

FINDINGS: Normal heart size and pulmonary vascularity. No focal airspace
disease or consolidation in the lungs. No blunting of costophrenic
angles. No pneumothorax. Mediastinal contours appear intact. Old
left rib fractures.
IMPRESSION: No active disease.

## 2015-12-30 IMAGING — CR DG CHEST 1V PORT
1 series · 1 of 1 positions shown · non-contrast
Comparison: None.

CLINICAL DATA: Acute myocardial infarct.  Coronary artery disease.

EXAM:
PORTABLE CHEST - 1 VIEW

[AP]
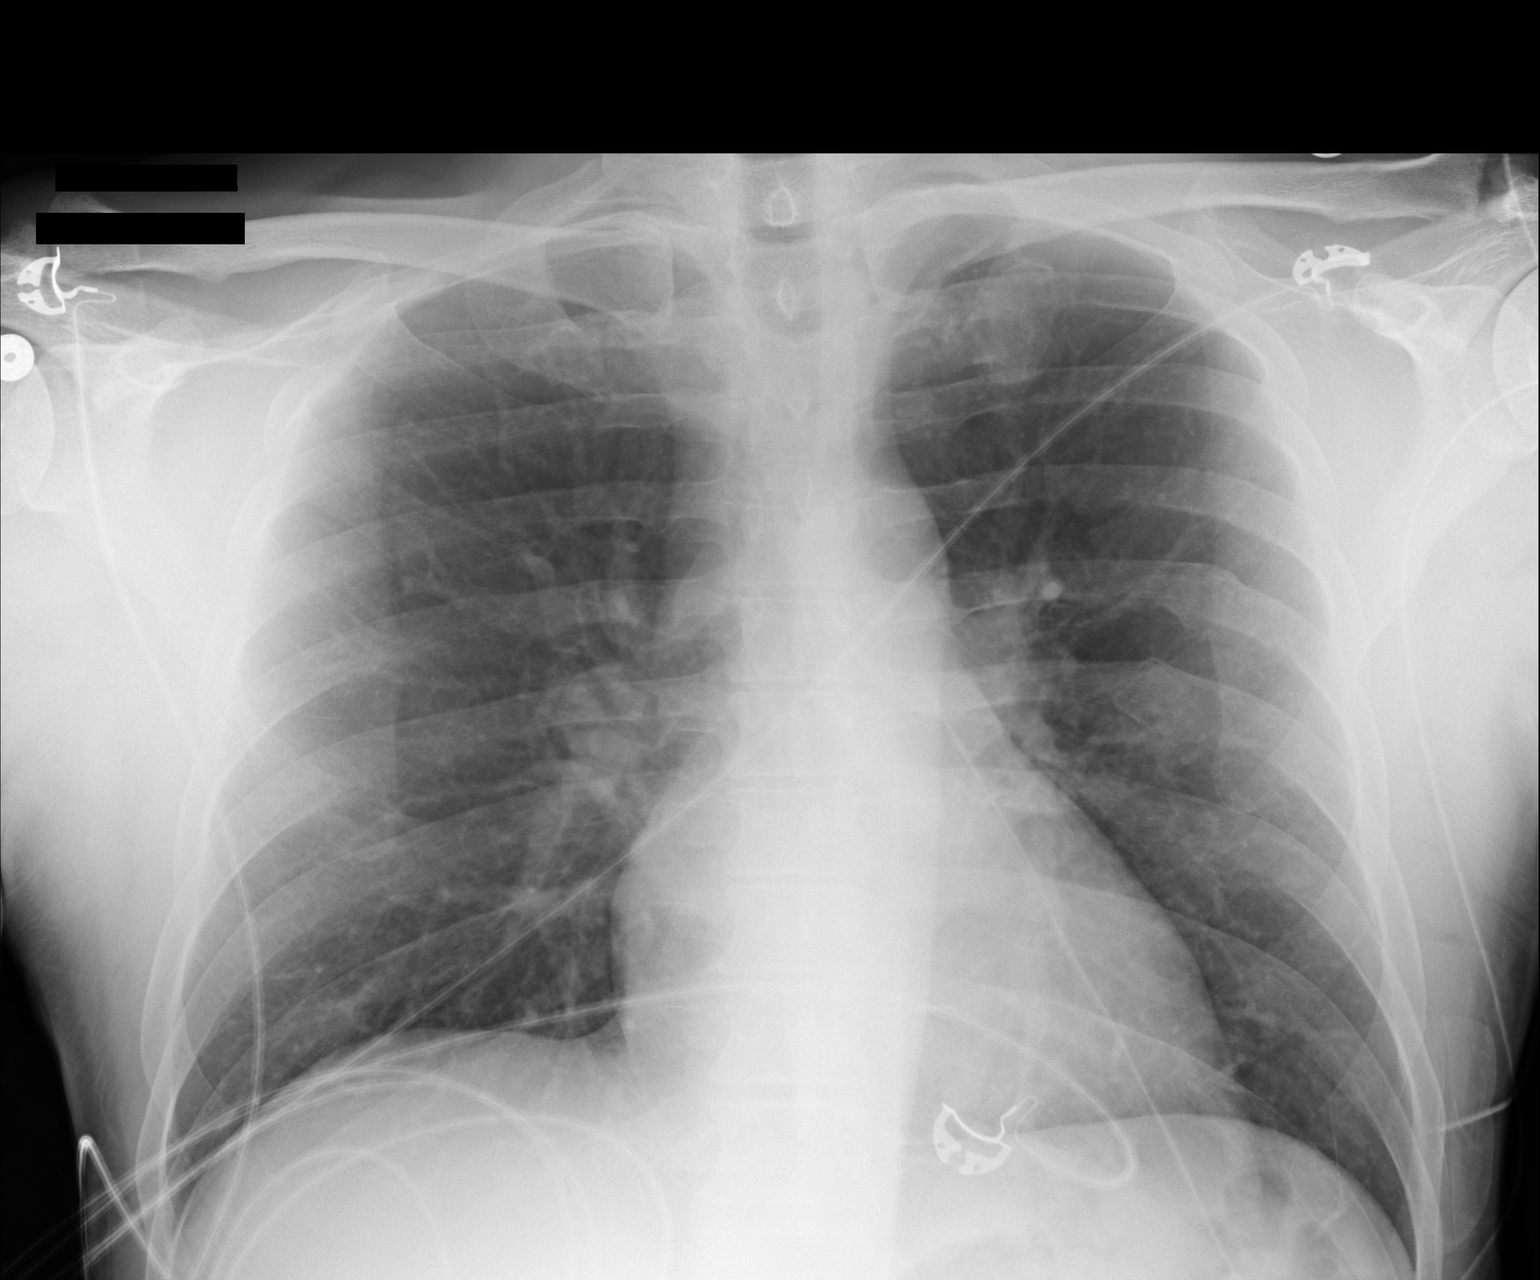

[1 of 1 positions shown; findings below may reference images not displayed]

FINDINGS: The heart size and mediastinal contours are within normal limits.
Both lungs are clear. No evidence pleural effusion. Several old left
posterior rib fracture deformities are noted.
IMPRESSION: No active disease.

## 2016-02-09 IMAGING — CT CT HEAD WITHOUT CONTRAST
1 series · 16 of 28 positions shown, 20 images · non-contrast
Comparison: None.

CLINICAL DATA: Left arm paresthesias and weakness for 2 days

EXAM:
CT HEAD WITHOUT CONTRAST
TECHNIQUE: Contiguous axial images were obtained from the base of the skull
through the vertex without intravenous contrast.

[Series 2: head wo · axial · 0.45mm/px · z∈[-171,-46]mm · 16 of 28 slices shown, 20 images]
[im 2/28  brain]
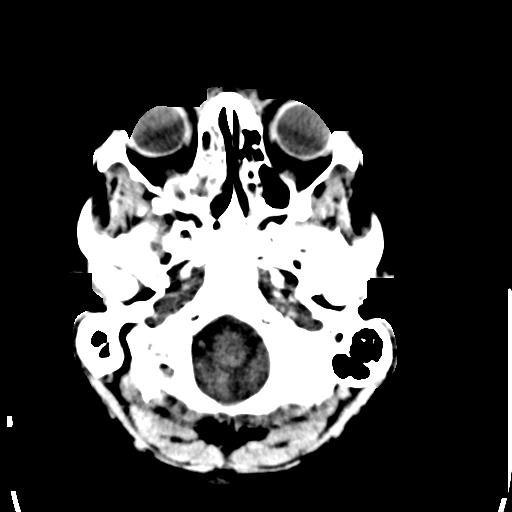
[im 2/28  bone]
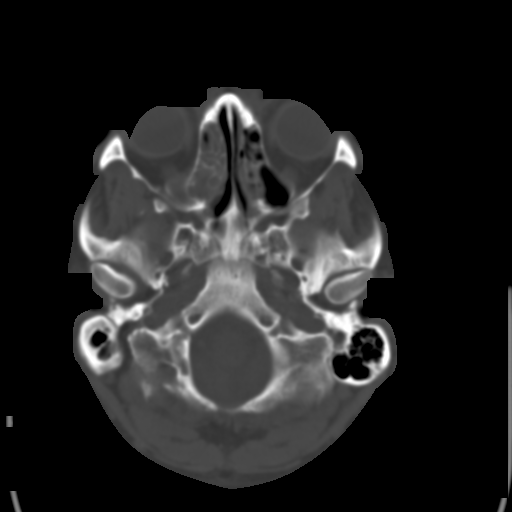
[im 4/28  brain]
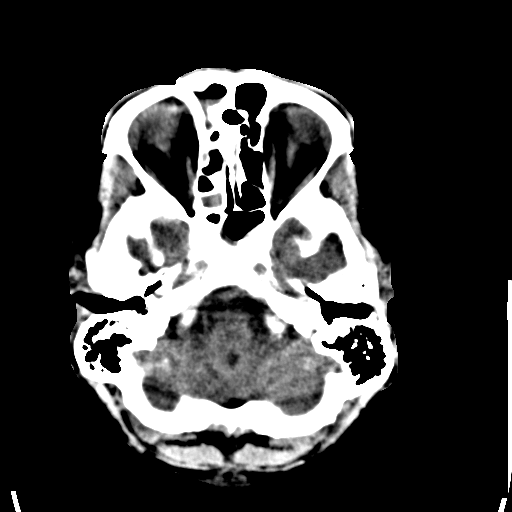
[im 6/28  brain]
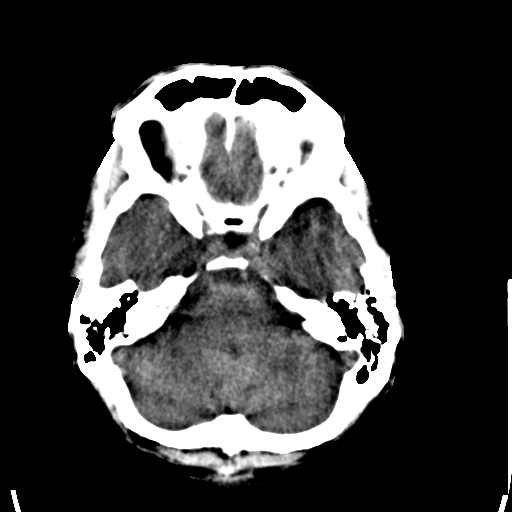
[im 7/28  brain]
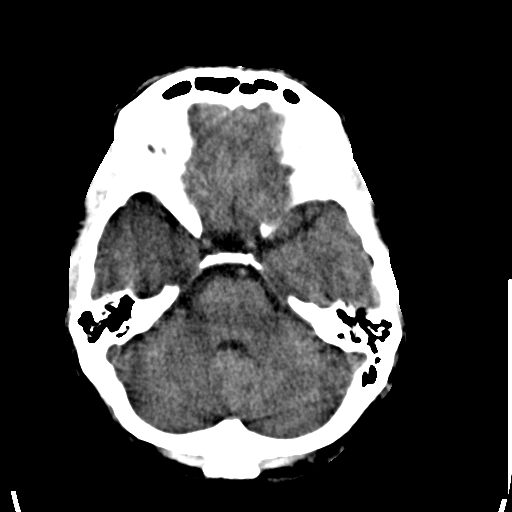
[im 9/28  brain]
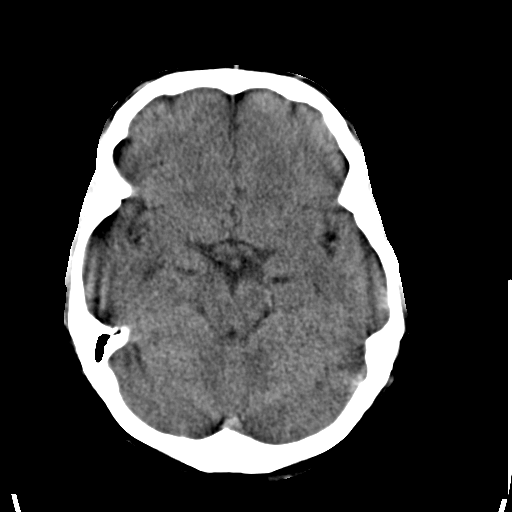
[im 9/28  bone]
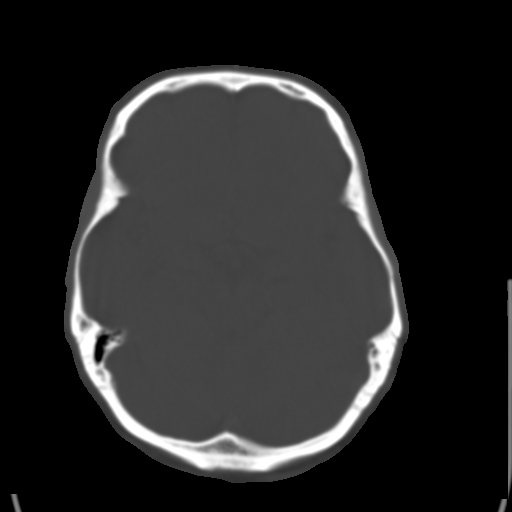
[im 10/28  brain]
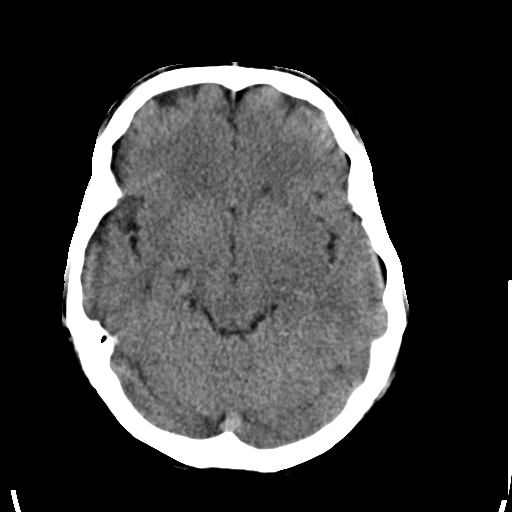
[im 12/28  brain]
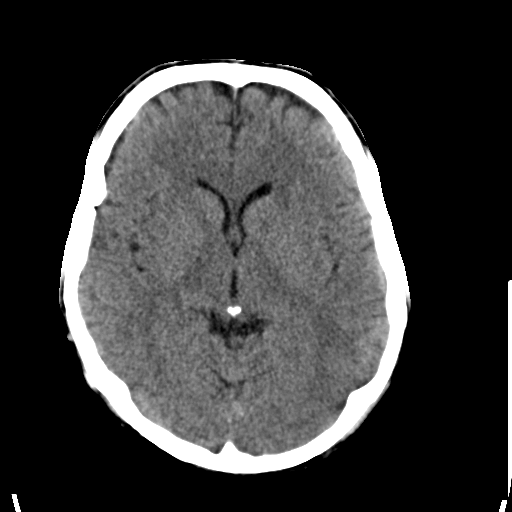
[im 14/28  brain]
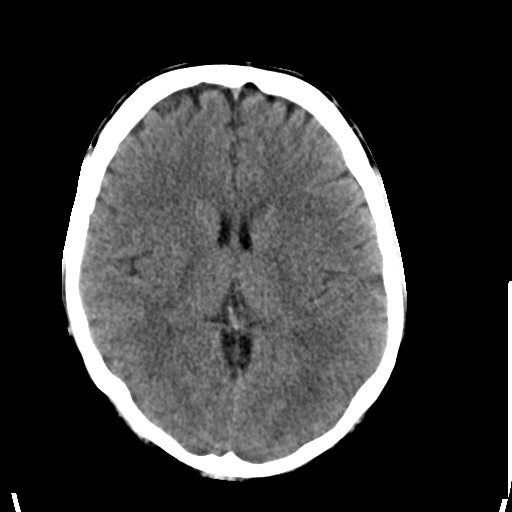
[im 15/28  brain]
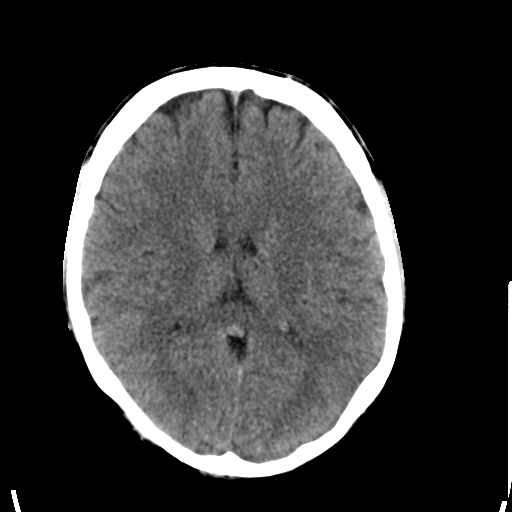
[im 15/28  bone]
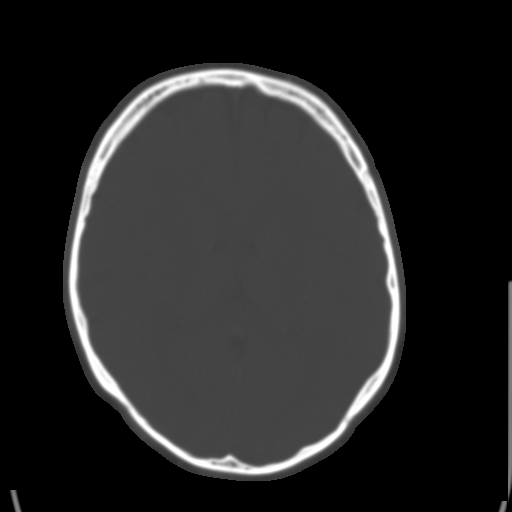
[im 17/28  brain]
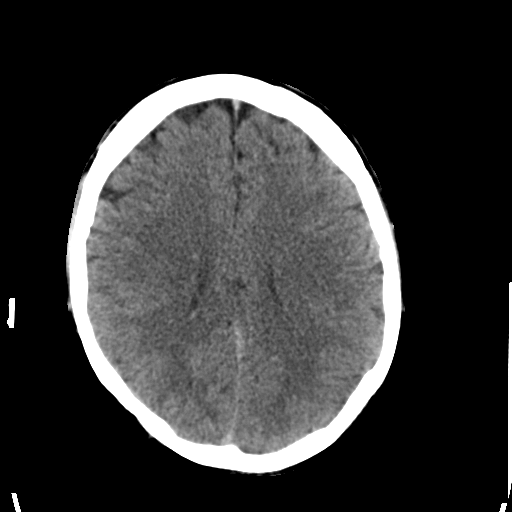
[im 19/28  brain]
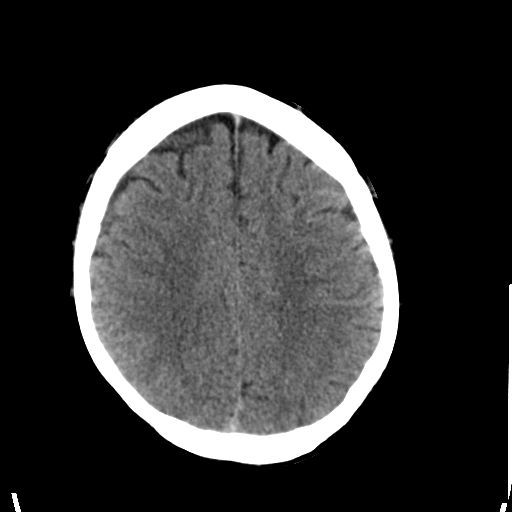
[im 20/28  brain]
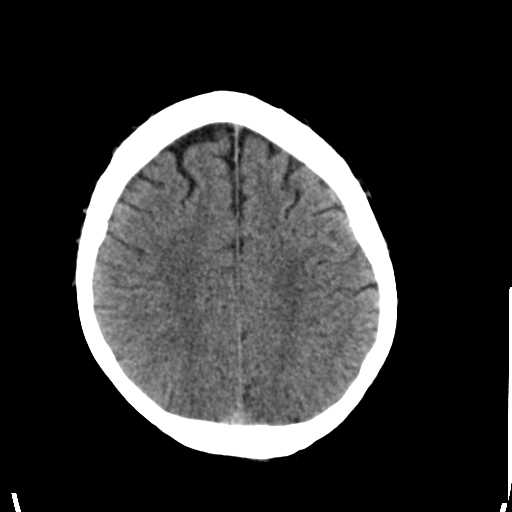
[im 22/28  brain]
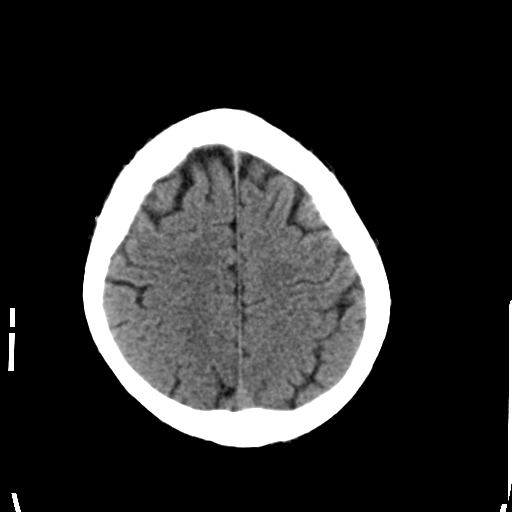
[im 22/28  bone]
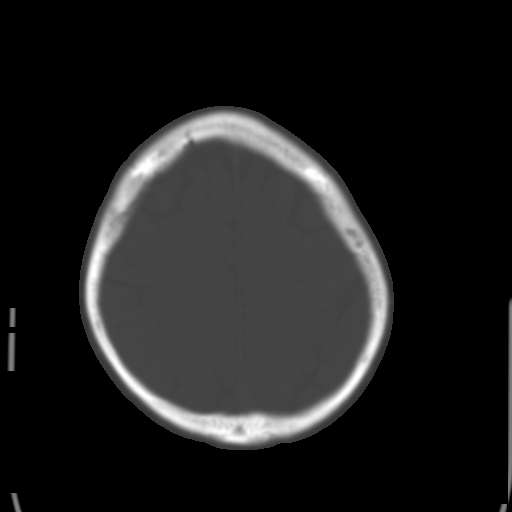
[im 23/28  brain]
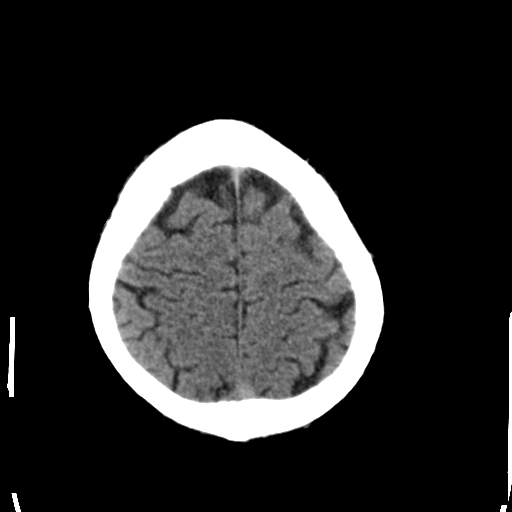
[im 25/28  brain]
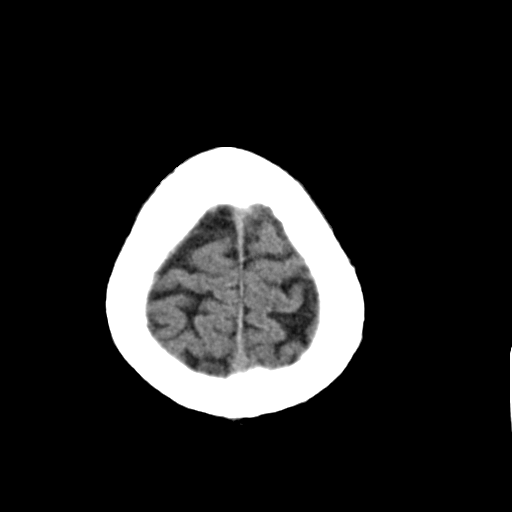
[im 27/28  brain]
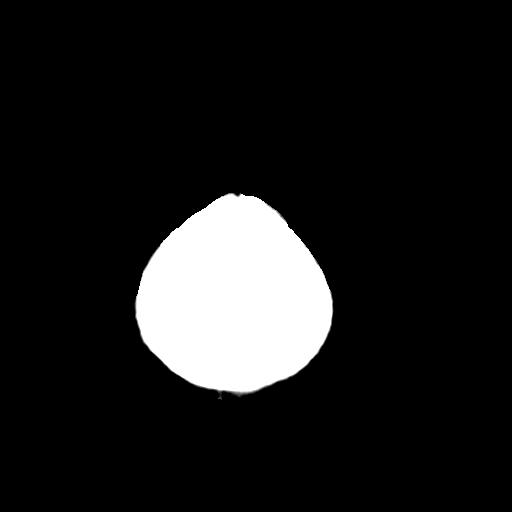

[16 of 28 positions shown; findings below may reference images not displayed]

FINDINGS: There is no intracranial hemorrhage, mass or evidence of acute
infarction. Gray matter and white matter are normal. The ventricles
and basal cisterns appear unremarkable.

The bony structures are intact. There is opacification of many of
the ethmoid air cells, membrane thickening in the sphenoid sinuses,
right frontal sinus and and both maxillary sinuses, incompletely
imaged.
IMPRESSION: Normal brain

Paranasal sinus disease

## 2016-09-01 HISTORY — PX: CORONARY ANGIOPLASTY WITH STENT PLACEMENT: SHX49

## 2016-09-28 DIAGNOSIS — R110 Nausea: Secondary | ICD-10-CM | POA: Diagnosis not present

## 2016-09-28 DIAGNOSIS — R61 Generalized hyperhidrosis: Secondary | ICD-10-CM | POA: Diagnosis not present

## 2016-09-28 DIAGNOSIS — I219 Acute myocardial infarction, unspecified: Secondary | ICD-10-CM | POA: Diagnosis not present

## 2016-09-28 DIAGNOSIS — I2111 ST elevation (STEMI) myocardial infarction involving right coronary artery: Secondary | ICD-10-CM | POA: Diagnosis not present

## 2016-09-28 DIAGNOSIS — R0789 Other chest pain: Secondary | ICD-10-CM | POA: Diagnosis not present

## 2016-10-28 DIAGNOSIS — I2510 Atherosclerotic heart disease of native coronary artery without angina pectoris: Secondary | ICD-10-CM | POA: Diagnosis not present

## 2016-10-28 LAB — HM HIV SCREENING LAB: HM HIV Screening: NEGATIVE

## 2016-10-29 DIAGNOSIS — R918 Other nonspecific abnormal finding of lung field: Secondary | ICD-10-CM | POA: Diagnosis not present

## 2016-10-29 DIAGNOSIS — Z4682 Encounter for fitting and adjustment of non-vascular catheter: Secondary | ICD-10-CM | POA: Diagnosis not present

## 2016-10-29 DIAGNOSIS — I2510 Atherosclerotic heart disease of native coronary artery without angina pectoris: Secondary | ICD-10-CM | POA: Diagnosis not present

## 2016-10-29 HISTORY — PX: CORONARY ARTERY BYPASS GRAFT: SHX141

## 2016-10-30 DIAGNOSIS — J9811 Atelectasis: Secondary | ICD-10-CM | POA: Diagnosis not present

## 2016-10-30 DIAGNOSIS — J90 Pleural effusion, not elsewhere classified: Secondary | ICD-10-CM | POA: Diagnosis not present

## 2016-10-30 DIAGNOSIS — Z48812 Encounter for surgical aftercare following surgery on the circulatory system: Secondary | ICD-10-CM | POA: Diagnosis not present

## 2016-11-01 DIAGNOSIS — J90 Pleural effusion, not elsewhere classified: Secondary | ICD-10-CM | POA: Diagnosis not present

## 2016-11-01 DIAGNOSIS — K6389 Other specified diseases of intestine: Secondary | ICD-10-CM | POA: Diagnosis not present

## 2016-11-01 DIAGNOSIS — Z4682 Encounter for fitting and adjustment of non-vascular catheter: Secondary | ICD-10-CM | POA: Diagnosis not present

## 2016-11-12 DIAGNOSIS — R0602 Shortness of breath: Secondary | ICD-10-CM | POA: Diagnosis not present

## 2016-11-12 DIAGNOSIS — R071 Chest pain on breathing: Secondary | ICD-10-CM | POA: Diagnosis not present

## 2016-11-12 DIAGNOSIS — T8131XA Disruption of external operation (surgical) wound, not elsewhere classified, initial encounter: Secondary | ICD-10-CM | POA: Diagnosis not present

## 2016-11-12 DIAGNOSIS — R05 Cough: Secondary | ICD-10-CM | POA: Diagnosis not present

## 2016-12-31 ENCOUNTER — Ambulatory Visit: Payer: Medicaid Other | Admitting: Family Medicine

## 2017-01-01 ENCOUNTER — Encounter: Payer: Self-pay | Admitting: Family Medicine

## 2017-01-01 ENCOUNTER — Ambulatory Visit (INDEPENDENT_AMBULATORY_CARE_PROVIDER_SITE_OTHER): Payer: Medicaid Other | Admitting: Family Medicine

## 2017-01-01 VITALS — BP 121/78 | HR 77 | Temp 98.7°F | Ht 71.5 in | Wt 204.0 lb

## 2017-01-01 DIAGNOSIS — I509 Heart failure, unspecified: Secondary | ICD-10-CM

## 2017-01-01 DIAGNOSIS — W3400XA Accidental discharge from unspecified firearms or gun, initial encounter: Secondary | ICD-10-CM | POA: Diagnosis not present

## 2017-01-01 DIAGNOSIS — E782 Mixed hyperlipidemia: Secondary | ICD-10-CM

## 2017-01-01 DIAGNOSIS — G8929 Other chronic pain: Secondary | ICD-10-CM

## 2017-01-01 DIAGNOSIS — Z7689 Persons encountering health services in other specified circumstances: Secondary | ICD-10-CM | POA: Diagnosis not present

## 2017-01-01 DIAGNOSIS — M5441 Lumbago with sciatica, right side: Secondary | ICD-10-CM

## 2017-01-01 DIAGNOSIS — Z720 Tobacco use: Secondary | ICD-10-CM

## 2017-01-01 MED ORDER — ATORVASTATIN CALCIUM 40 MG PO TABS: 40.0000 mg | ORAL_TABLET | Freq: Every day | ORAL | 3 refills | Status: AC
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
## 2017-01-01 NOTE — Patient Instructions (Addendum)
Bupropion sustained-release tablets (smoking cessation) What is this medicine? BUPROPION (byoo PROE pee on) is used to help people quit smoking. This medicine may be used for other purposes; ask your health care provider or pharmacist if you have questions. COMMON BRAND NAME(S): Buproban, Zyban What should I tell my health care provider before I take this medicine? They need to know if you have any of these conditions: -an eating disorder, such as anorexia or bulimia -bipolar disorder or psychosis -diabetes or high blood sugar, treated with medication -glaucoma -head injury or brain tumor -heart disease, previous heart attack, or irregular heart beat -high blood pressure -kidney or liver disease -seizures -suicidal thoughts or a previous suicide attempt -Tourette's syndrome -weight loss -an unusual or allergic reaction to bupropion, other medicines, foods, dyes, or preservatives -breast-feeding -pregnant or trying to become pregnant How should I use this medicine? Take this medicine by mouth with a glass of water. Follow the directions on the prescription label. You can take it with or without food. If it upsets your stomach, take it with food. Do not cut, crush or chew this medicine. Take your medicine at regular intervals. If you take this medicine more than once a day, take your second dose at least 8 hours after you take your first dose. To limit difficulty in sleeping, avoid taking this medicine at bedtime. Do not take your medicine more often than directed. Do not stop taking this medicine suddenly except upon the advice of your doctor. Stopping this medicine too quickly may cause serious side effects. A special MedGuide will be given to you by the pharmacist with each prescription and refill. Be sure to read this information carefully each time. Talk to your pediatrician regarding the use of this medicine in children. Special care may be needed. Overdosage: If you think you have  taken too much of this medicine contact a poison control center or emergency room at once. NOTE: This medicine is only for you. Do not share this medicine with others. What if I miss a dose? If you miss a dose, skip the missed dose and take your next tablet at the regular time. There should be at least 8 hours between doses. Do not take double or extra doses. What may interact with this medicine? Do not take this medicine with any of the following medications: -linezolid -MAOIs like Azilect, Carbex, Eldepryl, Marplan, Nardil, and Parnate -methylene blue (injected into a vein) -other medicines that contain bupropion like Wellbutrin This medicine may also interact with the following medications: -alcohol -certain medicines for anxiety or sleep -certain medicines for blood pressure like metoprolol, propranolol -certain medicines for depression or psychotic disturbances -certain medicines for HIV or AIDS like efavirenz, lopinavir, nelfinavir, ritonavir -certain medicines for irregular heart beat like propafenone, flecainide -certain medicines for Parkinson's disease like amantadine, levodopa -certain medicines for seizures like carbamazepine, phenytoin, phenobarbital -cimetidine -clopidogrel -cyclophosphamide -digoxin -furazolidone -isoniazid -nicotine -orphenadrine -procarbazine -steroid medicines like prednisone or cortisone -stimulant medicines for attention disorders, weight loss, or to stay awake -tamoxifen -theophylline -thiotepa -ticlopidine -tramadol -warfarin This list may not describe all possible interactions. Give your health care provider a list of all the medicines, herbs, non-prescription drugs, or dietary supplements you use. Also tell them if you smoke, drink alcohol, or use illegal drugs. Some items may interact with your medicine. What should I watch for while using this medicine? Visit your doctor or health care professional for regular checks on your progress.  This medicine should be used together with a   patient support program. It is important to participate in a behavioral program, counseling, or other support program that is recommended by your health care professional. Patients and their families should watch out for new or worsening thoughts of suicide or depression. Also watch out for sudden changes in feelings such as feeling anxious, agitated, panicky, irritable, hostile, aggressive, impulsive, severely restless, overly excited and hyperactive, or not being able to sleep. If this happens, especially at the beginning of treatment or after a change in dose, call your health care professional. Avoid alcoholic drinks while taking this medicine. Drinking excessive alcoholic beverages, using sleeping or anxiety medicines, or quickly stopping the use of these agents while taking this medicine may increase your risk for a seizure. Do not drive or use heavy machinery until you know how this medicine affects you. This medicine can impair your ability to perform these tasks. Do not take this medicine close to bedtime. It may prevent you from sleeping. Your mouth may get dry. Chewing sugarless gum or sucking hard candy, and drinking plenty of water may help. Contact your doctor if the problem does not go away or is severe. Do not use nicotine patches or chewing gum without the advice of your doctor or health care professional while taking this medicine. You may need to have your blood pressure taken regularly if your doctor recommends that you use both nicotine and this medicine together. What side effects may I notice from receiving this medicine? Side effects that you should report to your doctor or health care professional as soon as possible: -allergic reactions like skin rash, itching or hives, swelling of the face, lips, or tongue -breathing problems -changes in vision -confusion -elevated mood, decreased need for sleep, racing thoughts, impulsive  behavior -fast or irregular heartbeat -hallucinations, loss of contact with reality -increased blood pressure -redness, blistering, peeling or loosening of the skin, including inside the mouth -seizures -suicidal thoughts or other mood changes -unusually weak or tired -vomiting Side effects that usually do not require medical attention (report to your doctor or health care professional if they continue or are bothersome): -constipation -headache -loss of appetite -nausea -tremors -weight loss This list may not describe all possible side effects. Call your doctor for medical advice about side effects. You may report side effects to FDA at 1-800-FDA-1088. Where should I keep my medicine? Keep out of the reach of children. Store at room temperature between 20 and 25 degrees C (68 and 77 degrees F). Protect from light. Keep container tightly closed. Throw away any unused medicine after the expiration date. NOTE: This sheet is a summary. It may not cover all possible information. If you have questions about this medicine, talk to your doctor, pharmacist, or health care provider.  2018 Elsevier/Gold Standard (2015-08-11 13:49:28)  

## 2017-01-04 ENCOUNTER — Encounter: Payer: Self-pay | Admitting: Family Medicine

## 2017-01-04 DIAGNOSIS — I509 Heart failure, unspecified: Secondary | ICD-10-CM | POA: Insufficient documentation

## 2017-01-04 NOTE — Assessment & Plan Note (Signed)
Wanting to quit, discussed use of patches and/or gums as well as support groups. Offered zyban as an option, pt refused.

## 2017-01-04 NOTE — Assessment & Plan Note (Signed)
Reviewed importance of staying on a cholesterol medication given cardiac hx and prevention of future episodes. Will restart lipitor at half the dose and monitor closely for tolerance. Recheck fasting labs in 6 months

## 2017-01-04 NOTE — Assessment & Plan Note (Signed)
Followed by Dr. Mayford Knife with Cardiology. Pt states he's been cleared for only PCP f/u but looking at communications in chart it appears he no-showed his most recent appt and clinic has reached out to him to reschedule overdue f/u. Discussed with pt the importance of close Cardiology f/u given his significant hx

## 2017-01-17 DIAGNOSIS — M47816 Spondylosis without myelopathy or radiculopathy, lumbar region: Secondary | ICD-10-CM | POA: Diagnosis not present

## 2017-01-17 DIAGNOSIS — Z79891 Long term (current) use of opiate analgesic: Secondary | ICD-10-CM | POA: Diagnosis not present

## 2017-01-17 DIAGNOSIS — Z5181 Encounter for therapeutic drug level monitoring: Secondary | ICD-10-CM | POA: Diagnosis not present

## 2017-01-17 DIAGNOSIS — G894 Chronic pain syndrome: Secondary | ICD-10-CM | POA: Diagnosis not present

## 2017-01-17 DIAGNOSIS — M5416 Radiculopathy, lumbar region: Secondary | ICD-10-CM | POA: Diagnosis not present

## 2017-03-04 IMAGING — CR DG CHEST 2V
1 series · 2 of 2 positions shown · non-contrast
Comparison: Chest radiograph performed 05/21/2014

CLINICAL DATA: Acute onset of mid chest pain and left arm numbness.
Shortness of breath. Initial encounter.

EXAM:
CHEST  2 VIEW

[Series 1: w chest pa · 0.14mm/px · 2 of 2 slices shown]
[im 1/2]
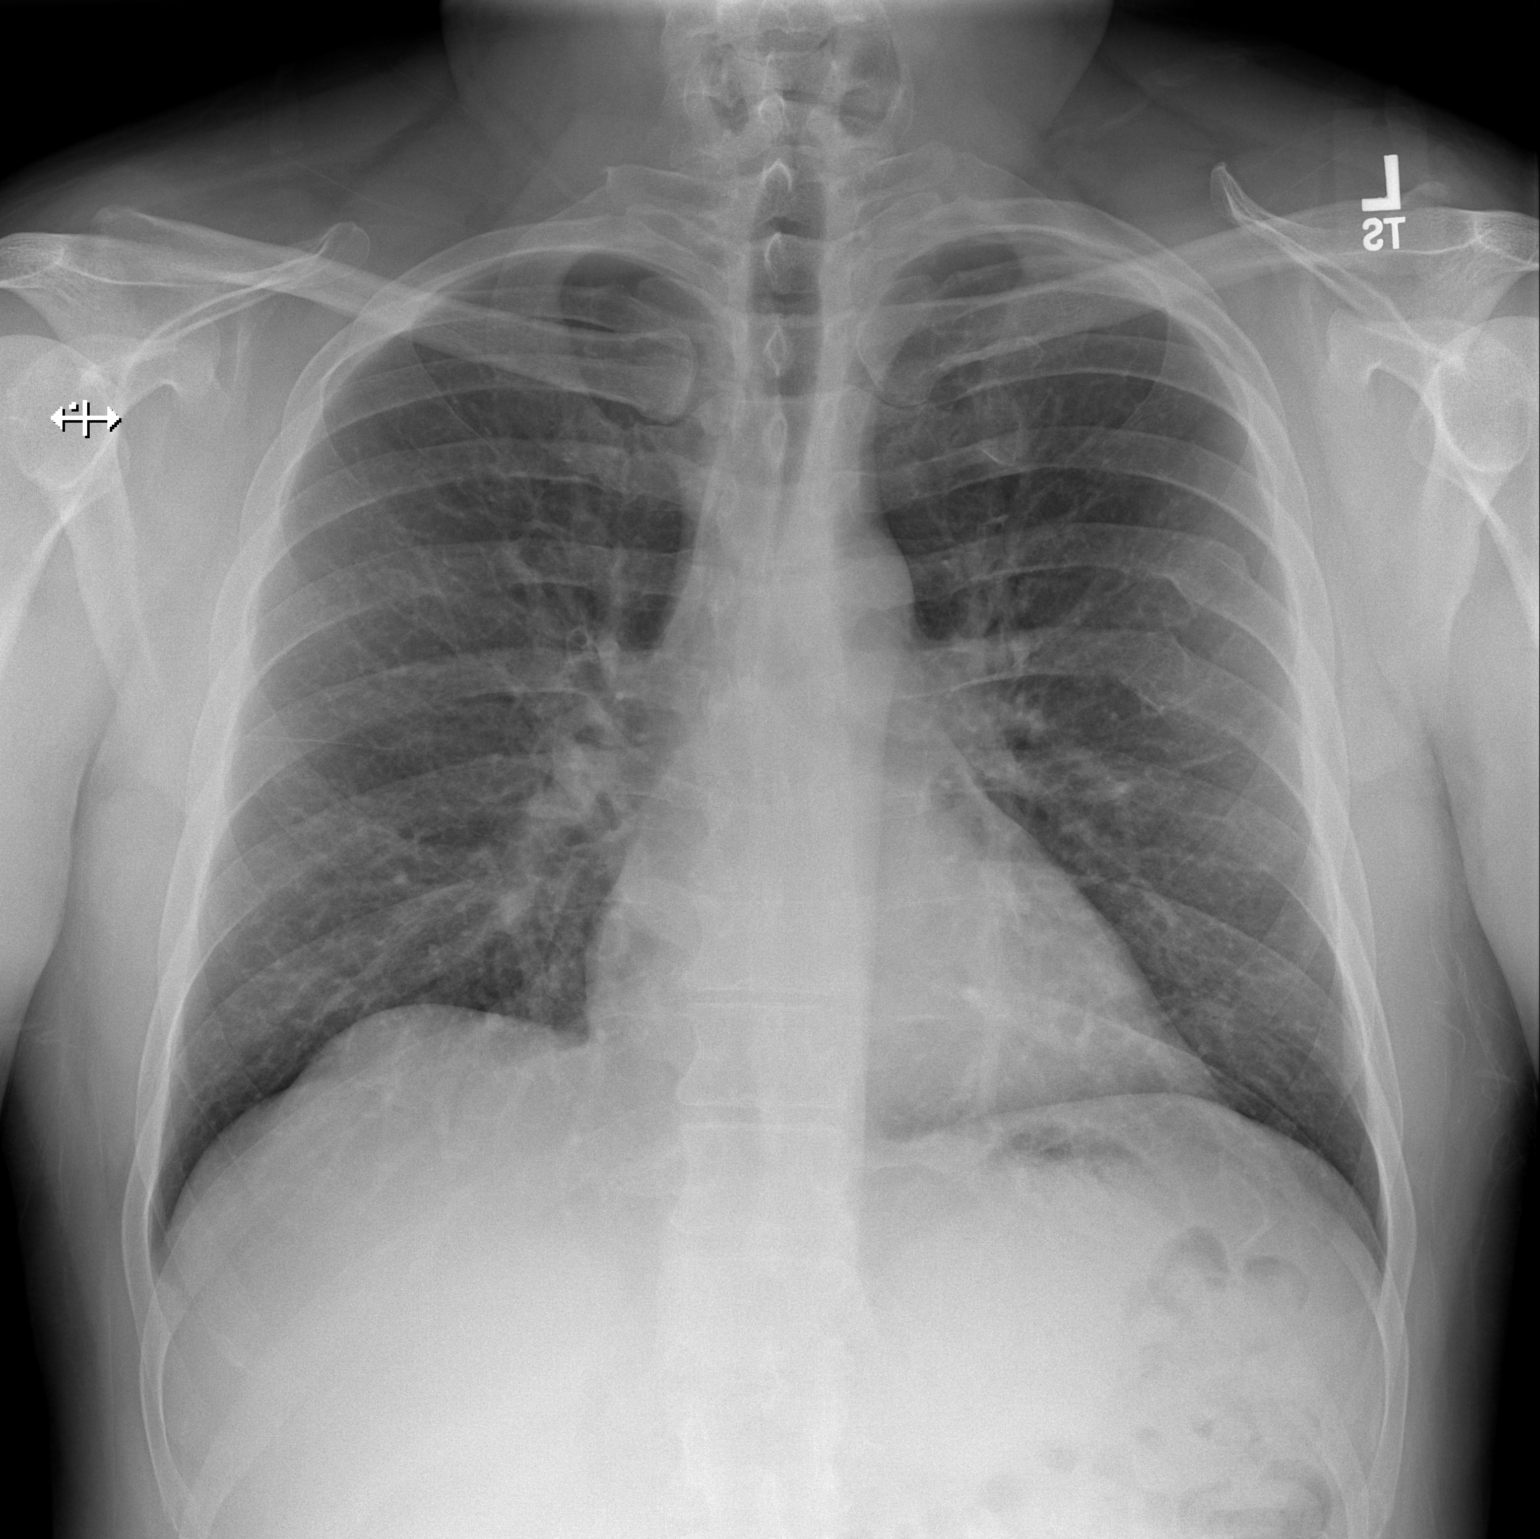
[im 2/2]
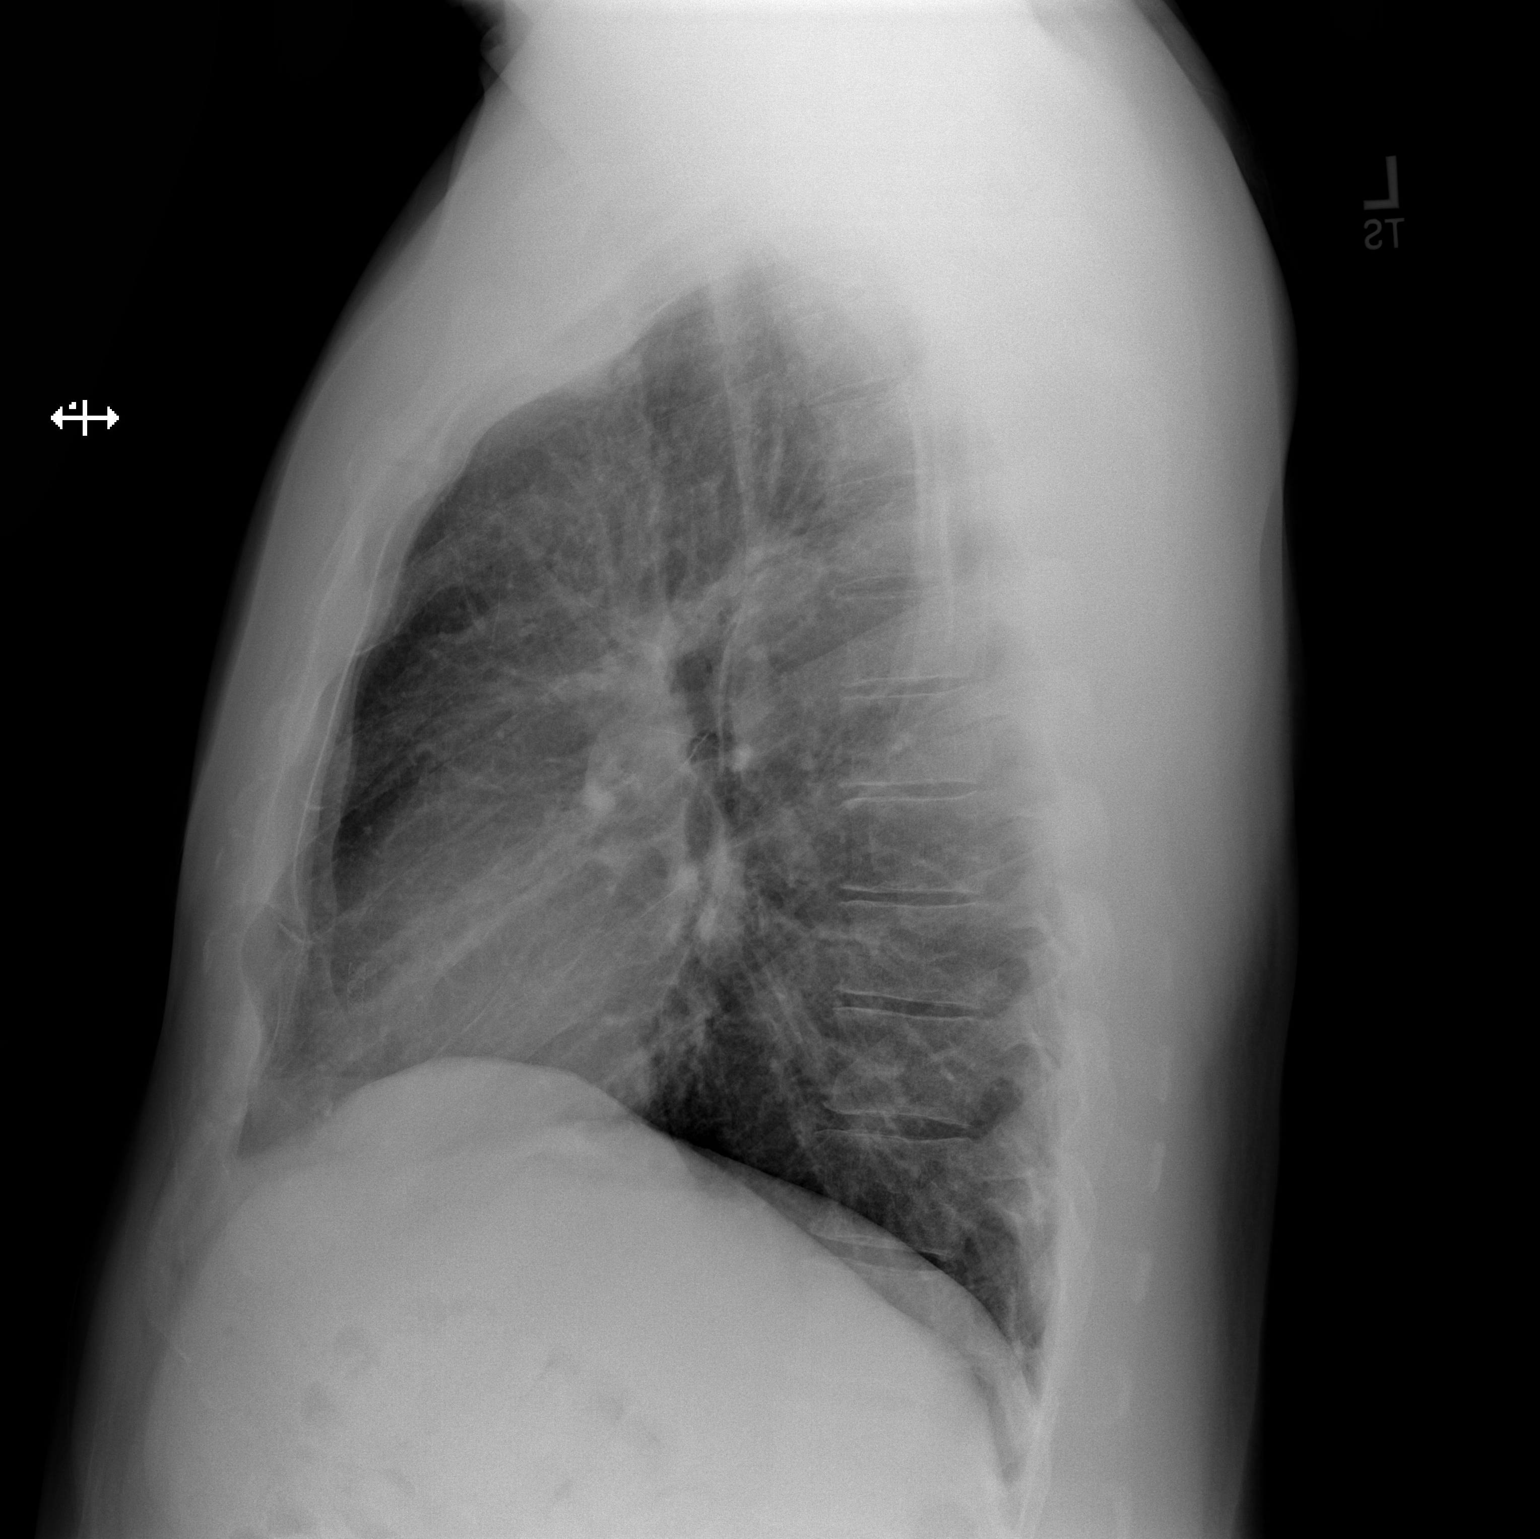

[2 of 2 positions shown; findings below may reference images not displayed]

FINDINGS: The lungs are well-aerated and clear. There is no evidence of focal
opacification, pleural effusion or pneumothorax.

The heart is normal in size; the mediastinal contour is within
normal limits. No acute osseous abnormalities are seen. Chronic
left-sided rib deformities are again noted.
IMPRESSION: No acute cardiopulmonary process seen.

## 2017-07-02 DIAGNOSIS — R918 Other nonspecific abnormal finding of lung field: Secondary | ICD-10-CM | POA: Diagnosis not present

## 2017-07-02 DIAGNOSIS — R0602 Shortness of breath: Secondary | ICD-10-CM | POA: Diagnosis not present

## 2017-11-09 DIAGNOSIS — M79602 Pain in left arm: Secondary | ICD-10-CM | POA: Diagnosis not present

## 2017-11-09 DIAGNOSIS — R0789 Other chest pain: Secondary | ICD-10-CM | POA: Diagnosis not present

## 2017-11-13 DIAGNOSIS — E7800 Pure hypercholesterolemia, unspecified: Secondary | ICD-10-CM | POA: Diagnosis not present

## 2017-11-13 DIAGNOSIS — I2510 Atherosclerotic heart disease of native coronary artery without angina pectoris: Secondary | ICD-10-CM | POA: Diagnosis not present

## 2019-01-25 DIAGNOSIS — B182 Chronic viral hepatitis C: Secondary | ICD-10-CM | POA: Diagnosis not present

## 2019-02-10 DIAGNOSIS — B182 Chronic viral hepatitis C: Secondary | ICD-10-CM | POA: Diagnosis not present

## 2019-02-10 DIAGNOSIS — M545 Low back pain: Secondary | ICD-10-CM | POA: Diagnosis not present

## 2019-02-10 DIAGNOSIS — I252 Old myocardial infarction: Secondary | ICD-10-CM | POA: Diagnosis not present

## 2019-02-10 DIAGNOSIS — M5417 Radiculopathy, lumbosacral region: Secondary | ICD-10-CM | POA: Diagnosis not present

## 2019-02-10 DIAGNOSIS — Z87898 Personal history of other specified conditions: Secondary | ICD-10-CM | POA: Diagnosis not present

## 2019-02-10 DIAGNOSIS — K7402 Hepatic fibrosis, advanced fibrosis: Secondary | ICD-10-CM | POA: Diagnosis not present

## 2019-02-24 DIAGNOSIS — B182 Chronic viral hepatitis C: Secondary | ICD-10-CM | POA: Diagnosis not present

## 2019-02-24 DIAGNOSIS — K7402 Hepatic fibrosis, advanced fibrosis: Secondary | ICD-10-CM | POA: Diagnosis not present

## 2019-03-24 DIAGNOSIS — Z23 Encounter for immunization: Secondary | ICD-10-CM | POA: Diagnosis not present

## 2019-03-24 DIAGNOSIS — K7402 Hepatic fibrosis, advanced fibrosis: Secondary | ICD-10-CM | POA: Diagnosis not present

## 2019-03-24 DIAGNOSIS — B182 Chronic viral hepatitis C: Secondary | ICD-10-CM | POA: Diagnosis not present

## 2019-04-21 DIAGNOSIS — K7402 Hepatic fibrosis, advanced fibrosis: Secondary | ICD-10-CM | POA: Diagnosis not present

## 2019-04-21 DIAGNOSIS — B182 Chronic viral hepatitis C: Secondary | ICD-10-CM | POA: Diagnosis not present

## 2019-06-22 DIAGNOSIS — K7402 Hepatic fibrosis, advanced fibrosis: Secondary | ICD-10-CM | POA: Diagnosis not present

## 2019-06-22 DIAGNOSIS — K56609 Unspecified intestinal obstruction, unspecified as to partial versus complete obstruction: Secondary | ICD-10-CM | POA: Diagnosis not present

## 2019-09-01 DIAGNOSIS — S39012A Strain of muscle, fascia and tendon of lower back, initial encounter: Secondary | ICD-10-CM | POA: Diagnosis not present

## 2019-09-04 DIAGNOSIS — S39012A Strain of muscle, fascia and tendon of lower back, initial encounter: Secondary | ICD-10-CM | POA: Diagnosis not present

## 2019-09-04 DIAGNOSIS — M545 Low back pain: Secondary | ICD-10-CM | POA: Diagnosis not present

## 2019-09-04 DIAGNOSIS — M5136 Other intervertebral disc degeneration, lumbar region: Secondary | ICD-10-CM | POA: Diagnosis not present

## 2019-09-04 DIAGNOSIS — G8911 Acute pain due to trauma: Secondary | ICD-10-CM | POA: Diagnosis not present

## 2020-01-11 DIAGNOSIS — R079 Chest pain, unspecified: Secondary | ICD-10-CM | POA: Diagnosis not present

## 2020-01-11 DIAGNOSIS — I252 Old myocardial infarction: Secondary | ICD-10-CM | POA: Diagnosis not present

## 2020-02-08 DIAGNOSIS — R079 Chest pain, unspecified: Secondary | ICD-10-CM | POA: Diagnosis not present

## 2020-02-08 DIAGNOSIS — I2510 Atherosclerotic heart disease of native coronary artery without angina pectoris: Secondary | ICD-10-CM | POA: Diagnosis not present

## 2020-02-08 DIAGNOSIS — B182 Chronic viral hepatitis C: Secondary | ICD-10-CM | POA: Diagnosis not present

## 2020-03-02 DIAGNOSIS — Z720 Tobacco use: Secondary | ICD-10-CM | POA: Diagnosis not present

## 2020-03-02 DIAGNOSIS — I10 Essential (primary) hypertension: Secondary | ICD-10-CM | POA: Diagnosis not present

## 2020-03-02 DIAGNOSIS — E782 Mixed hyperlipidemia: Secondary | ICD-10-CM | POA: Diagnosis not present

## 2020-03-02 DIAGNOSIS — I2510 Atherosclerotic heart disease of native coronary artery without angina pectoris: Secondary | ICD-10-CM | POA: Diagnosis not present

## 2020-04-12 DIAGNOSIS — H938X2 Other specified disorders of left ear: Secondary | ICD-10-CM | POA: Diagnosis not present

## 2020-04-12 DIAGNOSIS — R42 Dizziness and giddiness: Secondary | ICD-10-CM | POA: Diagnosis not present

## 2020-08-07 DIAGNOSIS — I10 Essential (primary) hypertension: Secondary | ICD-10-CM | POA: Diagnosis not present

## 2020-08-07 DIAGNOSIS — I25118 Atherosclerotic heart disease of native coronary artery with other forms of angina pectoris: Secondary | ICD-10-CM | POA: Diagnosis not present

## 2020-08-07 DIAGNOSIS — E782 Mixed hyperlipidemia: Secondary | ICD-10-CM | POA: Diagnosis not present

## 2020-08-07 DIAGNOSIS — Z720 Tobacco use: Secondary | ICD-10-CM | POA: Diagnosis not present

## 2020-10-03 DIAGNOSIS — I25118 Atherosclerotic heart disease of native coronary artery with other forms of angina pectoris: Secondary | ICD-10-CM | POA: Diagnosis not present

## 2020-10-12 DIAGNOSIS — I25118 Atherosclerotic heart disease of native coronary artery with other forms of angina pectoris: Secondary | ICD-10-CM | POA: Diagnosis not present

## 2020-10-12 DIAGNOSIS — R9439 Abnormal result of other cardiovascular function study: Secondary | ICD-10-CM | POA: Diagnosis not present

## 2020-10-12 DIAGNOSIS — Z955 Presence of coronary angioplasty implant and graft: Secondary | ICD-10-CM | POA: Diagnosis not present

## 2020-11-15 DIAGNOSIS — H5213 Myopia, bilateral: Secondary | ICD-10-CM | POA: Diagnosis not present

## 2021-09-02 DIAGNOSIS — S60411A Abrasion of left index finger, initial encounter: Secondary | ICD-10-CM | POA: Diagnosis not present

## 2021-09-02 DIAGNOSIS — S61412A Laceration without foreign body of left hand, initial encounter: Secondary | ICD-10-CM | POA: Diagnosis not present

## 2021-09-02 DIAGNOSIS — S61211A Laceration without foreign body of left index finger without damage to nail, initial encounter: Secondary | ICD-10-CM | POA: Diagnosis not present

## 2023-01-06 DIAGNOSIS — Z1331 Encounter for screening for depression: Secondary | ICD-10-CM | POA: Diagnosis not present

## 2023-01-06 DIAGNOSIS — Z1389 Encounter for screening for other disorder: Secondary | ICD-10-CM | POA: Diagnosis not present

## 2023-01-06 DIAGNOSIS — Z0000 Encounter for general adult medical examination without abnormal findings: Secondary | ICD-10-CM | POA: Diagnosis not present

## 2023-01-06 DIAGNOSIS — K7402 Hepatic fibrosis, advanced fibrosis: Secondary | ICD-10-CM | POA: Diagnosis not present

## 2023-01-06 DIAGNOSIS — I2510 Atherosclerotic heart disease of native coronary artery without angina pectoris: Secondary | ICD-10-CM | POA: Diagnosis not present

## 2023-01-23 DIAGNOSIS — K769 Liver disease, unspecified: Secondary | ICD-10-CM | POA: Diagnosis not present
